# Patient Record
Sex: Female | Born: 1990 | Race: White | Hispanic: Yes | Marital: Single | Smoking: Never smoker
Health system: Southern US, Community
[De-identification: ages and names within clinical notes are randomized; demographics above are authoritative.]

## PROBLEM LIST (undated history)

## (undated) DIAGNOSIS — J45909 Unspecified asthma, uncomplicated: Secondary | ICD-10-CM

## (undated) DIAGNOSIS — G8929 Other chronic pain: Secondary | ICD-10-CM

## (undated) DIAGNOSIS — J40 Bronchitis, not specified as acute or chronic: Secondary | ICD-10-CM

## (undated) DIAGNOSIS — R51 Headache: Secondary | ICD-10-CM

## (undated) DIAGNOSIS — T7840XA Allergy, unspecified, initial encounter: Secondary | ICD-10-CM

## (undated) HISTORY — DX: Headache: R51

## (undated) HISTORY — DX: Allergy, unspecified, initial encounter: T78.40XA

## (undated) HISTORY — PX: TONSILLECTOMY AND ADENOIDECTOMY: SHX28

## (undated) HISTORY — DX: Other chronic pain: G89.29

## (undated) HISTORY — DX: Bronchitis, not specified as acute or chronic: J40

## (undated) HISTORY — DX: Unspecified asthma, uncomplicated: J45.909

---
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
## 2012-06-29 ENCOUNTER — Encounter: Payer: Self-pay | Admitting: Internal Medicine

## 2012-06-29 ENCOUNTER — Ambulatory Visit (INDEPENDENT_AMBULATORY_CARE_PROVIDER_SITE_OTHER): Payer: Managed Care, Other (non HMO) | Admitting: Internal Medicine

## 2012-06-29 VITALS — BP 102/70 | HR 90 | Temp 97.9°F | Ht 64.0 in | Wt 115.8 lb

## 2012-06-29 DIAGNOSIS — J45991 Cough variant asthma: Secondary | ICD-10-CM

## 2012-06-29 MED ORDER — BUDESONIDE-FORMOTEROL FUMARATE 80-4.5 MCG/ACT IN AERO: 2.0000 | INHALATION_SPRAY | Freq: Two times a day (BID) | RESPIRATORY_TRACT | Status: DC

## 2012-06-29 MED ORDER — TRAMADOL HCL 50 MG PO TABS: ORAL_TABLET | ORAL | Status: DC

## 2012-06-29 NOTE — Patient Instructions (Addendum)
Symbicort 80 Take 2 puffs first thing in am and then another 2 puffs about 12 hours later.   Stop advair and codeine cough syrup   Take mucinex dm  every 12 hours and supplement if needed with  tramadol 50 mg up to 1-2  every 4 hours to suppress the urge to cough. Swallowing water or using ice chips/non mint and menthol or chocolate containing candies (such as lifesavers or sugarless jolly ranchers) are also effective.  You should rest your voice and avoid activities that you know make you cough.  Once you have eliminated the cough for 3 straight days try reducing the tramadol first,  then the delsym as tolerated.    Use proaire for chest tightness.   Return before Dec 25 if not better, if improving fill symbicort and return in 4 weeks

## 2012-06-29 NOTE — Progress Notes (Signed)
  Subjective:    Patient ID: Megan Holland, female    DOB: 1991-03-27  MRN: [id number]  HPI  21 yowf with pattern of recurrent cough since age 21 referred to pulmonary clinic 06/29/2012 by Urgent Care for persistent cough.  06/29/2012 1st pulmonary eval cc recurrent cough typically in fall and then again 2-3 times each winter better with prednisone but problems coming off it with nausea and dehydration so rx with advair since late November 2013 and not better p onset in sept 2013. Cough is mostly dry, worse when lies down unless takes codeine containing cough syrups.   Takes proaire and helps tightness and occ sob, worse with temp changes  Sleeping ok without nocturnal  or early am exacerbation  of respiratory  c/o's or need for noct saba. Also denies any obvious fluctuation of symptoms with weather or environmental changes or other aggravating or alleviating factors except as outlined above      Review of Systems  Constitutional: Negative for fever, chills and unexpected weight change.  HENT: Positive for congestion and sore throat. Negative for ear pain, nosebleeds, rhinorrhea, sneezing, trouble swallowing, dental problem, voice change, postnasal drip and sinus pressure.   Eyes: Negative for visual disturbance.  Respiratory: Positive for cough and shortness of breath. Negative for choking.   Cardiovascular: Negative for chest pain and leg swelling.  Gastrointestinal: Negative for vomiting, abdominal pain and diarrhea.  Genitourinary: Negative for difficulty urinating.  Musculoskeletal: Negative for arthralgias.  Skin: Negative for rash.  Neurological: Negative for tremors, syncope and headaches.  Hematological: Does not bruise/bleed easily.       Objective:   Physical Exam  Pleasant amb wf nad Gen: No acute distress. Well nourished and well groomed.  Neurological: Alert and oriented to person, place, and time. Coordination normal.  Head: Normocephalic and atraumatic.  Eyes:  Conjunctivae are normal. Pupils are equal, round, and reactive to light. No scleral icterus.  Neck: Normal range of motion. Neck supple. No tracheal deviation or thyromegaly present. No cervical lymphadenopathy.  Cardiovascular: Normal rate, regular rhythm, normal heart sounds and intact distal pulses. Exam reveals no gallop and no friction rub. No murmur heard.  Respiratory: Effort normal. No respiratory distress. No chest wall tenderness. Breath sounds normal. No wheezes, rales or rhonchi.  GI: Soft. Bowel sounds are normal. The abdomen is soft and nontender. There is no rebound and no guarding.  Musculoskeletal: Normal range of motion. Extremities are nontender.  Skin: Skin is warm and dry. No rash noted. No diaphoresis. No erythema. No pallor. No clubbing, cyanosis, or edema.  Psychiatric: Normal mood and affect. Behavior is normal. Judgment and thought content normal.         Assessment & Plan:

## 2012-06-30 DIAGNOSIS — J45991 Cough variant asthma: Secondary | ICD-10-CM | POA: Insufficient documentation

## 2012-06-30 NOTE — Assessment & Plan Note (Signed)
The most common causes of chronic cough in immunocompetent adults include the following: upper airway cough syndrome (UACS), previously referred to as postnasal drip syndrome (PNDS), which is caused by variety of rhinosinus conditions; (2) asthma; (3) GERD; (4) chronic bronchitis from cigarette smoking or other inhaled environmental irritants; (5) nonasthmatic eosinophilic bronchitis; and (6) bronchiectasis.   These conditions, singly or in combination, have accounted for up to 94% of the causes of chronic cough in prospective studies.   Other conditions have constituted no >6% of the causes in prospective studies These have included bronchogenic carcinoma, chronic interstitial pneumonia, sarcoidosis, left ventricular failure, ACEI-induced cough, and aspiration from a condition associated with pharyngeal dysfunction.    Chronic cough is often simultaneously caused by more than one condition. A single cause has been found from 38 to 82% of the time, multiple causes from 18 to 62%. Multiply caused cough has been the result of three diseases up to 42% of the time.       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  For now try symbicort 80 2bid  The proper method of use, as well as anticipated side effects, of a metered-dose inhaler are discussed and demonstrated to the patient. Improved effectiveness after extensive coaching during this visit to a level of approximately  90%

## 2012-07-27 ENCOUNTER — Ambulatory Visit (INDEPENDENT_AMBULATORY_CARE_PROVIDER_SITE_OTHER): Payer: Managed Care, Other (non HMO) | Admitting: Internal Medicine

## 2012-07-27 ENCOUNTER — Encounter: Payer: Self-pay | Admitting: Internal Medicine

## 2012-07-27 VITALS — BP 100/60 | HR 75 | Temp 97.3°F | Ht 64.0 in | Wt 117.8 lb

## 2012-07-27 DIAGNOSIS — J45991 Cough variant asthma: Secondary | ICD-10-CM

## 2012-07-27 MED ORDER — BUDESONIDE-FORMOTEROL FUMARATE 80-4.5 MCG/ACT IN AERO: 2.0000 | INHALATION_SPRAY | Freq: Two times a day (BID) | RESPIRATORY_TRACT | Status: DC

## 2012-07-27 NOTE — Patient Instructions (Addendum)
Plan A  symbicort 80 2 every 12 hours continue this until your next visit  Plan B Only use your albuterol as a rescue medication to be used if you can't catch your breath by resting or doing a relaxed purse lip breathing pattern. The less you use it, the better it will work when you need it.   For nasal symptoms obstruction try nasal saline as needed and call for prescription of singulair   Please schedule a follow up visit in 3 months but call sooner if needed with pft's on return

## 2012-07-27 NOTE — Progress Notes (Signed)
Subjective:    Patient ID: Megan Holland, female    DOB: 1990/08/12  MRN: [id number]  HPI  22 yowf with pattern of recurrent cough since age 22 referred to pulmonary clinic 06/29/2012 by Urgent Care for persistent cough.  06/29/2012 1st pulmonary eval cc recurrent cough typically in fall and then again 2-3 times each winter better with prednisone but problems coming off it with nausea and dehydration so rx with advair since late November 2013 and not better p onset in sept 2013. Cough is mostly dry, worse when lies down unless takes codeine containing cough syrups.  rec Symbicort 80 Take 2 puffs first thing in am and then another 2 puffs about 12 hours later.  Stop advair and codeine cough syrup   Take mucinex dm  every 12 hours and supplement if needed with  tramadol 50 mg up to 1-2  every 4 hours  Once you have eliminated the cough for 3 straight days try reducing the tramadol first,  then the delsym as tolerated.   Use proaire for chest tightness.    07/27/2012 f/u ov/Elner Seifert cc completely better, no  sob or chest tightness or need for daytime saba or need for cough suppression  No obvious daytime variabilty or assoc chronic cough or cp or chest tightness, subjective wheeze overt sinus or hb symptoms. No unusual exp hx   Takes proaire and helps tightness and occ sob, worse with temp changes  Sleeping ok without nocturnal  or early am exacerbation  of respiratory  c/o's or need for noct saba. Also denies any obvious fluctuation of symptoms with weather or environmental changes or other aggravating or alleviating factors except as outlined above   ROS  The following are not active complaints unless bolded sore throat, dysphagia, dental problems, itching, sneezing,  nasal congestion or excess/ purulent secretions, ear ache,   fever, chills, sweats, unintended wt loss, pleuritic or exertional cp, hemoptysis,  orthopnea pnd or leg swelling, presyncope, palpitations, heartburn, abdominal pain,  anorexia, nausea, vomiting, diarrhea  or change in bowel or urinary habits, change in stools or urine, dysuria,hematuria,  rash, arthralgias, visual complaints, headache, numbness weakness or ataxia or problems with walking or coordination,  change in mood/affect or memory.               Objective:   Physical Exam  Wt Readings from Last 3 Encounters:  07/27/12 117 lb 12.8 oz (53.434 kg) (31.31%*)  06/29/12 115 lb 12.8 oz (52.527 kg) (27.48%*)  10/24/11 111 lb 12.8 oz (50.712 kg) (22.01%*)   * Growth percentiles are based on CDC 2-20 Years data.    Pleasant amb wf nad Gen: No acute distress. Well nourished and well groomed.  Neurological: Alert and oriented to person, place, and time. Coordination normal.  Head: Normocephalic and atraumatic.  Eyes: Conjunctivae are normal. Pupils are equal, round, and reactive to light. No scleral icterus.  Neck: Normal range of motion. Neck supple. No tracheal deviation or thyromegaly present. No cervical lymphadenopathy.  Cardiovascular: Normal rate, regular rhythm, normal heart sounds and intact distal pulses. Exam reveals no gallop and no friction rub. No murmur heard.  Respiratory: Effort normal. No respiratory distress. No chest wall tenderness. Breath sounds normal. No wheezes, rales or rhonchi.  GI: Soft. Bowel sounds are normal. The abdomen is soft and nontender. There is no rebound and no guarding.  Musculoskeletal: Normal range of motion. Extremities are nontender.  Skin: Skin is warm and dry. No rash noted. No diaphoresis. No erythema. No pallor.  No clubbing, cyanosis, or edema.  Psychiatric: Normal mood and affect. Behavior is normal. Judgment and thought content normal.         Assessment & Plan:

## 2012-07-28 NOTE — Assessment & Plan Note (Addendum)
-   hfa 90% 06/29/12  All goals of chronic asthma control met including optimal function and elimination of symptoms with minimal need for rescue therapy.  Contingencies discussed in full including contacting this office immediately if not controlling the symptoms using the rule of two's.    May consider step down to qvar p 3 months if all symptoms controlled and need for saba eliminated consistently

## 2012-11-01 ENCOUNTER — Telehealth: Payer: Self-pay | Admitting: Internal Medicine

## 2012-11-01 NOTE — Telephone Encounter (Signed)
Attempted to call pt x's 3 to make next ov per recall.  PT never returned calls.  Mailed recall letter 11/01/12. Megan Holland °

## 2012-12-06 ENCOUNTER — Institutional Professional Consult (permissible substitution): Payer: Managed Care, Other (non HMO) | Admitting: Cardiovascular Disease

## 2013-01-16 ENCOUNTER — Institutional Professional Consult (permissible substitution): Payer: Managed Care, Other (non HMO) | Admitting: Cardiovascular Disease

## 2013-02-27 ENCOUNTER — Encounter: Payer: Self-pay | Admitting: Cardiovascular Disease

## 2013-03-27 ENCOUNTER — Ambulatory Visit: Payer: Managed Care, Other (non HMO) | Admitting: Internal Medicine

## 2013-04-06 ENCOUNTER — Ambulatory Visit (INDEPENDENT_AMBULATORY_CARE_PROVIDER_SITE_OTHER): Payer: Managed Care, Other (non HMO) | Admitting: Internal Medicine

## 2013-04-06 ENCOUNTER — Encounter: Payer: Self-pay | Admitting: Internal Medicine

## 2013-04-06 VITALS — BP 112/70 | HR 80 | Temp 98.3°F | Ht 64.0 in | Wt 125.0 lb

## 2013-04-06 DIAGNOSIS — J45991 Cough variant asthma: Secondary | ICD-10-CM

## 2013-04-06 NOTE — Patient Instructions (Addendum)
Symbicort 80 2 puffs each am and take again 12 hours later if having any cough or breathing problems at all   Only use your albuterol(proaire)  as a rescue medication to be used if you can't catch your breath by resting or doing a relaxed purse lip breathing pattern. The less you use it, the better it will work when you need it. Don't leave home without it.  Pulmonary follow up in 6 months but call sooner if needed  (can add singulair to your plan over the phone if your chronic symptoms worsen at all)

## 2013-04-06 NOTE — Progress Notes (Signed)
Subjective:    Patient ID: Megan Holland, female    DOB: 09-14-1990  MRN: [id number]    Brief patient profile:  22 yowf  [employer] studying Kinesiology with pattern of recurrent cough since age 22 referred to pulmonary clinic 06/29/2012 by Urgent Care for persistent cough.    History of Present Illness  06/29/2012 1st pulmonary eval cc recurrent cough typically in fall and then again 2-3 times each winter better with prednisone but problems coming off it with nausea and dehydration so rx with advair since late November 2013 and not better p onset in sept 2013. Cough is mostly dry, worse when lies down unless takes codeine containing cough syrups.  rec Symbicort 80 Take 2 puffs first thing in am and then another 2 puffs about 12 hours later.  Stop advair and codeine cough syrup   Take mucinex dm  every 12 hours and supplement if needed with  tramadol 50 mg up to 1-2  every 4 hours  Once you have eliminated the cough for 3 straight days try reducing the tramadol first,  then the delsym as tolerated.   Use proaire for chest tightness.    07/27/2012 f/u ov/Megan Holland cc completely better, no  sob or chest tightness or need for daytime saba or need for cough suppression rec Plan A  symbicort 80 2 every 12 hours continue this until your next visit Plan B Only use your albuterol as a rescue medication to be used if you can't catch your breath by resting or doing a relaxed purse lip breathing pattern. The less you use it, the better it will work when you need it.  For nasal symptoms obstruction try nasal saline as needed and call for prescription of singulair    04/06/2013 f/u ov/Megan Holland re:  Chief Complaint  Patient presents with  . Follow-up    Pt c/o cough x 2 days- dry cough. Comes and goes with no specific trigger.   did not use symbicort on day of ov, no longer using saba at all, avg symb just 2 daily Nasal symptoms "forever"  no better though not really bothering her   No obvious day to  day or daytime variabilty or assoc chronic cough or cp or chest tightness, subjective wheeze overt  hb symptoms. No unusual exp hx or h/o childhood pna/ asthma or knowledge of premature birth.  Sleeping ok without nocturnal  or early am exacerbation  of respiratory  c/o's or need for noct saba. Also denies any obvious fluctuation of symptoms with weather or environmental changes or other aggravating or alleviating factors except as outlined above   Current Medications, Allergies, Complete Past Medical History, Past Surgical History, Family History, and Social History were reviewed in [employer] record.  ROS  The following are not active complaints unless bolded sore throat, dysphagia, dental problems, itching, sneezing,  nasal congestion or excess/ purulent secretions, ear ache,   fever, chills, sweats, unintended wt loss, pleuritic or exertional cp, hemoptysis,  orthopnea pnd or leg swelling, presyncope, palpitations, heartburn, abdominal pain, anorexia, nausea, vomiting, diarrhea  or change in bowel or urinary habits, change in stools or urine, dysuria,hematuria,  rash, arthralgias, visual complaints, headache, numbness weakness or ataxia or problems with walking or coordination,  change in mood/affect or memory.                    Objective:   Physical Exam  Wt Readings from Last 3 Encounters:  04/06/13 125 lb (56.7 kg) (  44%*, Z = -0.16)  07/27/12 117 lb 12.8 oz (53.434 kg) (31%*, Z = -0.49)  06/29/12 115 lb 12.8 oz (52.527 kg) (27%*, Z = -0.60)   * Growth percentiles are based on CDC 2-20 Years data.     Marland KitchenHEENT: nl dentition, turbinates, and orophanx. Nl external ear canals without cough reflex   NECK :  without JVD/Nodes/TM/ nl carotid upstrokes bilaterally   LUNGS: no acc muscle use, clear to A and P bilaterally without cough on insp or exp maneuvers   CV:  RRR  no s3 or murmur or increase in P2, no edema   ABD:  soft and nontender with nl  excursion in the supine position. No bruits or organomegaly, bowel sounds nl  MS:  warm without deformities, calf tenderness, cyanosis or clubbing  SKIN: warm and dry without lesions         spirometry 04/06/2013 wnl       Assessment & Plan:

## 2013-04-06 NOTE — Assessment & Plan Note (Signed)
-   hfa 90% 06/29/12> rx symbicort 80 > resolved  - spirometry wnl  04/06/2013   All goals of chronic asthma control met including optimal function and elimination of symptoms with minimal need for rescue therapy.  Contingencies discussed in full including contacting this office immediately if not controlling the symptoms using the rule of two's.     See instructions for specific recommendations which were reviewed directly with the patient who was given a copy with highlighter outlining the key components.

## 2013-11-23 ENCOUNTER — Ambulatory Visit: Payer: Managed Care, Other (non HMO) | Admitting: Internal Medicine

## 2013-11-30 ENCOUNTER — Ambulatory Visit (INDEPENDENT_AMBULATORY_CARE_PROVIDER_SITE_OTHER): Payer: Managed Care, Other (non HMO) | Admitting: Internal Medicine

## 2013-11-30 ENCOUNTER — Encounter: Payer: Self-pay | Admitting: Internal Medicine

## 2013-11-30 ENCOUNTER — Other Ambulatory Visit (INDEPENDENT_AMBULATORY_CARE_PROVIDER_SITE_OTHER): Payer: Managed Care, Other (non HMO)

## 2013-11-30 VITALS — BP 110/68 | HR 66 | Temp 98.1°F | Ht 65.0 in | Wt 130.4 lb

## 2013-11-30 DIAGNOSIS — J310 Chronic rhinitis: Secondary | ICD-10-CM

## 2013-11-30 DIAGNOSIS — J45991 Cough variant asthma: Secondary | ICD-10-CM

## 2013-11-30 LAB — CBC WITH DIFFERENTIAL/PLATELET
BASOS PCT: 0.5 % (ref 0.0–3.0)
Basophils Absolute: 0 10*3/uL (ref 0.0–0.1)
Eosinophils Absolute: 0 10*3/uL (ref 0.0–0.7)
Eosinophils Relative: 0.6 % (ref 0.0–5.0)
HCT: 43.6 % (ref 36.0–46.0)
HEMOGLOBIN: 14.8 g/dL (ref 12.0–15.0)
LYMPHS PCT: 29.7 % (ref 12.0–46.0)
Lymphs Abs: 1.5 10*3/uL (ref 0.7–4.0)
MCHC: 33.9 g/dL (ref 30.0–36.0)
MCV: 86.7 fl (ref 78.0–100.0)
MONOS PCT: 5.9 % (ref 3.0–12.0)
Monocytes Absolute: 0.3 10*3/uL (ref 0.1–1.0)
NEUTROS ABS: 3.2 10*3/uL (ref 1.4–7.7)
Neutrophils Relative %: 63.3 % (ref 43.0–77.0)
Platelets: 202 10*3/uL (ref 150.0–400.0)
RBC: 5.02 Mil/uL (ref 3.87–5.11)
RDW: 11.9 % (ref 11.5–14.6)
WBC: 5.1 10*3/uL (ref 4.5–10.5)

## 2013-11-30 MED ORDER — MONTELUKAST SODIUM 10 MG PO TABS: ORAL_TABLET | ORAL | Status: DC

## 2013-11-30 NOTE — Progress Notes (Signed)
Subjective:    Patient ID: Megan Holland, female    DOB: 03/20/1991  MRN: [id number]    Brief patient profile:  23 yowf  [employer] rising senior  studying Kinesiology with pattern of recurrent cough since age 89 referred to pulmonary clinic 06/29/2012 [facility] for persistent cough c/w cough variant asthma   History of Present Illness  06/29/2012 1st pulmonary eval cc recurrent cough typically in fall and then again 2-3 times each winter better with prednisone but problems coming off it with nausea and dehydration so rx with advair since late November 2013 and not better p onset in sept 2013. Cough is mostly dry, worse when lies down unless takes codeine containing cough syrups.  rec Symbicort 80 Take 2 puffs first thing in am and then another 2 puffs about 12 hours later.  Stop advair and codeine cough syrup   Take mucinex dm  every 12 hours and supplement if needed with  tramadol 50 mg up to 1-2  every 4 hours  Once you have eliminated the cough for 3 straight days try reducing the tramadol first,  then the delsym as tolerated.   Use proaire for chest tightness.    07/27/2012 f/u ov/Jovaun Levene cc completely better, no  sob or chest tightness or need for daytime saba or need for cough suppression rec Plan A  symbicort 80 2 every 12 hours continue this until your next visit Plan B Only use your albuterol as a rescue medication to be used if you can't catch your breath by resting or doing a relaxed purse lip breathing pattern. The less you use it, the better it will work when you need it.  For nasal symptoms obstruction try nasal saline as needed and call for prescription of singulair    04/06/2013 f/u ov/Madisun Hargrove re: cough variant asthma Chief Complaint  Patient presents with  . Follow-up    Pt c/o cough x 2 days- dry cough. Comes and goes with no specific trigger.   did not use symbicort on day of ov, no longer using saba at all, avg symb just 2 daily Nasal symptoms "forever"  no better  though not really bothering her  rec Symbicort 80 2 puffs each am and take again 12 hours later if having any cough or breathing problems at all Only use your albuterol(proaire)  as a rescue medication to be used if you can't catch your breath by resting or doing a relaxed purse lip breathing pattern. The less you use it, the better it will work when you need it. Don't leave home without it. rec Symbicort 80 2 puffs each am and take again 12 hours later if having any cough or breathing problems at all   Only use your albuterol(proaire)  as a rescue medication to be used if you can't catch your breath by resting or doing a relaxed purse lip breathing pattern. The less you use it, the better it will work when you need it. Don't leave home without it. Pulmonary follow up in 6 months but call sooner if needed  (can add singulair to your plan over the phone if your chronic symptoms worsen at all) Pulmonary follow up in 6 months but call sooner if needed  (can add singulair to your plan over the phone if your chronic symptoms worsen at all)   11/30/2013 f/u ov/Toneisha Savary re: on symbicort 80 2bid tapers to 2 qam when doing well Chief Complaint  Patient presents with  . Follow-up    Pt  states that her breathing overall doing well, has not needed rescue inhaler recently. She c/o nasal congestion that is not relieved with allergy meds.   nasal congestion x years year round, no better with clariton, no purulent secretions or seasonal flares  No obvious day to day or daytime variabilty or assoc chronic cough or cp or chest tightness, subjective wheeze overt  hb symptoms. No unusual exp hx or h/o childhood pna/ asthma or knowledge of premature birth.  Sleeping ok without nocturnal  or early am exacerbation  of respiratory  c/o's or need for noct saba. Also denies any obvious fluctuation of symptoms with weather or environmental changes or other aggravating or alleviating factors except as outlined above    Current Medications, Allergies, Complete Past Medical History, Past Surgical History, Family History, and Social History were reviewed in [facility] electronic medical record.  ROS  The following are not active complaints unless bolded sore throat, dysphagia, dental problems, itching, sneezing,  nasal congestion or excess/ purulent secretions, ear ache,   fever, chills, sweats, unintended wt loss, pleuritic or exertional cp, hemoptysis,  orthopnea pnd or leg swelling, presyncope, palpitations, heartburn, abdominal pain, anorexia, nausea, vomiting, diarrhea  or change in bowel or urinary habits, change in stools or urine, dysuria,hematuria,  rash, arthralgias, visual complaints, headache, numbness weakness or ataxia or problems with walking or coordination,  change in mood/affect or memory.                    Objective:   Physical Exam  11/30/13           130  Wt Readings from Last 3 Encounters:  04/06/13 125 lb (56.7 kg) (44%*, Z = -0.16)  07/27/12 117 lb 12.8 oz (53.434 kg) (31%*, Z = -0.49)  06/29/12 115 lb 12.8 oz (52.527 kg) (27%*, Z = -0.60)   * Growth percentiles are based on CDC 2-20 Years data.     Marland Kitchen.HEENT: nl dentition, turbinates, and orophanx. Nl external ear canals without cough reflex   NECK :  without JVD/Nodes/TM/ nl carotid upstrokes bilaterally   LUNGS: no acc muscle use, clear to A and P bilaterally without cough on insp or exp maneuvers   CV:  RRR  no s3 or murmur or increase in P2, no edema   ABD:  soft and nontender with nl excursion in the supine position. No bruits or organomegaly, bowel sounds nl  MS:  warm without deformities, calf tenderness, cyanosis or clubbing  SKIN: warm and dry without lesions         spirometry 04/06/2013 wnl       Assessment & Plan:

## 2013-11-30 NOTE — Patient Instructions (Signed)
singulair 10 mg one daily usually in evening x one month and then stop if no better  For drainage take chlortrimeton (chlorpheniramine) 4 mg every 4 hours available over the counter (may cause drowsiness)   GERD (REFLUX)  is an extremely common cause of respiratory symptoms, many times with no significant heartburn at all.    It can be treated with medication, but also with lifestyle changes including avoidance of late meals, excessive alcohol, smoking cessation, and avoid fatty foods, chocolate, peppermint, colas, red wine, and acidic juices such as orange juice.  NO MINT OR MENTHOL PRODUCTS SO NO COUGH DROPS  USE SUGARLESS CANDY INSTEAD (jolley ranchers or Stover's)  NO OIL BASED VITAMINS - use powdered substitutes.  Please remember to go to the lab   department downstairs for your tests - we will call you with the results when they are available.    Please schedule a follow up office visit in 6 m, call sooner if needed

## 2013-12-02 NOTE — Assessment & Plan Note (Addendum)
Has never tried singulair or undergone allergy eval > rec both plus trial of 1st gen H1

## 2013-12-02 NOTE — Assessment & Plan Note (Signed)
-  hfa 90% 06/29/12> rx symbicort 80 > resolved  - spirometry wnl  04/06/2013   All goals of chronic asthma control met including optimal function and elimination of symptoms with minimal need for rescue therapy.  Contingencies discussed in full including contacting this office immediately if not controlling the symptoms using the rule of two's.

## 2013-12-03 ENCOUNTER — Telehealth: Payer: Self-pay | Admitting: Internal Medicine

## 2013-12-03 ENCOUNTER — Encounter: Payer: Self-pay | Admitting: Internal Medicine

## 2013-12-03 LAB — ALLERGY PROFILE REGION II-DC, DE, MD, ~~LOC~~, VA
Allergen, D pternoyssinus,d7: 6.56 kU/L — ABNORMAL HIGH
Alternaria Alternata: <0.1 kU/L
Bermuda Grass: <0.1 kU/L
Box Elder IgE: <0.1 kU/L
Cat Dander: <0.1 kU/L
Cladosporium Herbarum: <0.1 kU/L
Cockroach: <0.1 kU/L
Common Ragweed: <0.1 kU/L
D. FARINAE: 9.3 kU/L — AB
Dog Dander: <0.1 kU/L
Elm IgE: <0.1 kU/L
IGE (IMMUNOGLOBULIN E), SERUM: 24.2 [IU]/mL (ref 0.0–180.0)
Meadow Grass: <0.1 kU/L
Oak: <0.1 kU/L
Pecan/Hickory Tree IgE: <0.1 kU/L

## 2013-12-03 NOTE — Progress Notes (Signed)
Quick Note:  LMTCB ______ 

## 2013-12-03 NOTE — Progress Notes (Signed)
Quick Note:  Spoke with pt and notified of results per Dr. Wert. Pt verbalized understanding and denied any questions.  ______ 

## 2013-12-03 NOTE — Telephone Encounter (Signed)
Spoke with pt and notified of lab results per Dr. Wert. Pt verbalized understanding and denied any questions.  

## 2014-04-14 ENCOUNTER — Ambulatory Visit (INDEPENDENT_AMBULATORY_CARE_PROVIDER_SITE_OTHER): Payer: Managed Care, Other (non HMO) | Admitting: Family Medicine

## 2014-04-14 VITALS — BP 100/62 | HR 81 | Temp 98.0°F | Resp 16 | Ht 63.0 in | Wt 131.2 lb

## 2014-04-14 DIAGNOSIS — J209 Acute bronchitis, unspecified: Secondary | ICD-10-CM

## 2014-04-14 LAB — POCT CBC
GRANULOCYTE PERCENT: 60.7 % (ref 37–80)
HCT, POC: 42.5 % (ref 37.7–47.9)
Hemoglobin: 13.9 g/dL (ref 12.2–16.2)
Lymph, poc: 2 (ref 0.6–3.4)
MCH, POC: 29 pg (ref 27–31.2)
MCHC: 32.8 g/dL (ref 31.8–35.4)
MCV: 88.5 fL (ref 80–97)
MID (cbc): 0.6 (ref 0–0.9)
MPV: 8.3 fL (ref 0–99.8)
POC GRANULOCYTE: 3.9 (ref 2–6.9)
POC LYMPH %: 30.7 % (ref 10–50)
POC MID %: 8.6 %M (ref 0–12)
Platelet Count, POC: 196 10*3/uL (ref 142–424)
RBC: 4.8 M/uL (ref 4.04–5.48)
RDW, POC: 12.2 %
WBC: 6.4 10*3/uL (ref 4.6–10.2)

## 2014-04-14 MED ORDER — HYDROCOD POLST-CHLORPHEN POLST 10-8 MG/5ML PO LQCR: 5.0000 mL | Freq: Two times a day (BID) | ORAL | Status: DC | PRN

## 2014-04-14 MED ORDER — DEXTROMETHORPHAN POLISTIREX 30 MG/5ML PO LQCR: 60.0000 mg | ORAL | Status: DC

## 2014-04-14 MED ORDER — HYDROCOD POLST-CPM POLST ER 5-4 MG PO CP12: 1.0000 | ORAL_CAPSULE | Freq: Two times a day (BID) | ORAL | Status: DC | PRN

## 2014-04-14 NOTE — Patient Instructions (Signed)

## 2014-04-14 NOTE — Progress Notes (Signed)
Subjective:    Patient ID: Nyhla Holland, female    DOB: [birth year], 23 y.o.   MRN: [id number] Chief Complaint  Patient presents with  . Bronchitis    [facility]-Not any better    HPI  Using symbicort but hasn't needed albuterol at all.  Sxs started 1 wk ago - she was treated with zpack and guaifenesin-codeine cough syrup which helps at night but not using anything during the day.  Using otc cough drops and mucinex which hasn't seemed to help.  Had a fever last week but still with nasal congestion.  Cough is productive in morning but more dry during the day.  No sinus pressure or pharyngitis but cough makes ribs/sides hurt. Gets bronchitis yearly.  Last yr had it for 4 mos and only thing that helped was codiene cough syrup and codiene pills. Pt reports zpack "never works for her".  Wonders if there is something she can take for her cough during the day since she doesn't want to use the hydrocodone while in class.  Delsym doesn't seem to be effective enough - she just coughs through it. Mucinex has not helped at all. Had severe allergic reaction to tessalon pearles.  Past Medical History  Diagnosis Date  . Bronchitis   . Chronic headaches   . Asthma   . Allergy    Current Outpatient Prescriptions on File Prior to Visit  Medication Sig Dispense Refill  . albuterol (PROAIR HFA) 108 (90 BASE) MCG/ACT inhaler Inhale 2 puffs into the lungs every 6 (six) hours as needed.      . Ascorbic Acid (VITAMIN C) 100 MG tablet Take 100 mg by mouth daily.      . budesonide-formoterol (SYMBICORT) 80-4.5 MCG/ACT inhaler Inhale 2 puffs into the lungs 2 (two) times daily.  1 Inhaler  12  . Cranberry 125 MG TABS Take 1 tablet by mouth daily.      . montelukast (SINGULAIR) 10 MG tablet One at bedtime every night  30 tablet  11  . Multiple Vitamins-Minerals (MULTIVITAL PO) Take 1 tablet by mouth daily.      . norethindrone-ethinyl estradiol (MICROGESTIN,JUNEL,LOESTRIN) 1-20 MG-MCG  tablet Take 1 tablet by mouth daily.       No current facility-administered medications on file prior to visit.   Allergies  Allergen Reactions  . Prednisone     Severe dehydration     Review of Systems  Constitutional: Positive for fatigue. Negative for fever, chills, diaphoresis and activity change.  HENT: Positive for congestion. Negative for rhinorrhea, sinus pressure and sore throat.   Respiratory: Positive for cough. Negative for chest tightness, shortness of breath and wheezing.   Cardiovascular: Positive for chest pain. Negative for palpitations and leg swelling.  Musculoskeletal: Positive for arthralgias. Negative for back pain.  Hematological: Negative for adenopathy.  Psychiatric/Behavioral: Positive for sleep disturbance.       Objective:   Physical Exam  Constitutional: She is oriented to person, place, and time. She appears well-developed and well-nourished. No distress.  HENT:  Head: Normocephalic and atraumatic.  Right Ear: Tympanic membrane, external ear and ear canal normal.  Left Ear: Tympanic membrane, external ear and ear canal normal.  Nose: Nose normal. No mucosal edema or rhinorrhea.  Mouth/Throat: Uvula is midline, oropharynx is clear and moist and mucous membranes are normal. No oropharyngeal exudate.  Eyes: Conjunctivae are normal. Right eye exhibits no discharge. Left eye exhibits no discharge. No scleral icterus.  Neck: Normal range of  motion. Neck supple.  Cardiovascular: Normal rate, regular rhythm, normal heart sounds and intact distal pulses.   Pulmonary/Chest: Effort normal and breath sounds normal.  Lymphadenopathy:    She has no cervical adenopathy.  Neurological: She is alert and oriented to person, place, and time.  Skin: Skin is warm and dry. She is not diaphoretic. No erythema.  Psychiatric: She has a normal mood and affect. Her behavior is normal.   BP 100/62  Pulse 81  Temp(Src) 98 F (36.7 C) (Oral)  Resp 16  Ht  (1.6 m)   Wt 131 lb 4 oz (59.535 kg)  BMI 23.26 kg/m2  SpO2 100%  LMP 03/24/2014  Frequent harsh dry hacking cough throughout OV.      Results for orders placed in visit on 04/14/14  POCT CBC      Result Value Ref Range   WBC 6.4  4.6 - 10.2 K/uL   Lymph, poc 2.0  0.6 - 3.4   POC LYMPH PERCENT 30.7  10 - 50 %L   MID (cbc) 0.6  0 - 0.9   POC MID % 8.6  0 - 12 %M   POC Granulocyte 3.9  2 - 6.9   Granulocyte percent 60.7  37 - 80 %G   RBC 4.80  4.04 - 5.48 M/uL   Hemoglobin 13.9  12.2 - 16.2 g/dL   HCT, POC 16.1  09.6 - 47.9 %   MCV 88.5  80 - 97 fL   MCH, POC 29.0  27 - 31.2 pg   MCHC 32.8  31.8 - 35.4 g/dL   RDW, POC 04.5     Platelet Count, POC 196  142 - 424 K/uL   MPV 8.3  0 - 99.8 fL     Assessment & Plan:   Acute bronchitis, unspecified organism - Plan: POCT CBC S/p zpack w/o sig improvement w/ nml cbc so viral etiology. Use delsym qam and hydrocodone qhs.  RTC if sxs worsen or asthma flairs.  Allergic to prednisone - cont singulair and symbicort. Meds ordered this encounter  Medications  . dextromethorphan (DELSYM) 30 MG/5ML liquid    Sig: Take 10 mLs (60 mg total) by mouth every morning.    Dispense:  89 mL    Refill:  0  . chlorpheniramine-HYDROcodone (TUSSIONEX PENNKINETIC ER) 10-8 MG/5ML LQCR    Sig: Take 5 mLs by mouth every 12 (twelve) hours as needed.    Dispense:  60 mL    Refill:  0  . Hydrocod Polst-Chlorphen Polst 5-4 MG CP12    Sig: Take 1 tablet by mouth 2 (two) times daily as needed.    Dispense:  14 each    Refill:  0     Norberto Sorenson, MD MPH

## 2014-05-29 ENCOUNTER — Encounter: Payer: Self-pay | Admitting: Internal Medicine

## 2014-05-29 ENCOUNTER — Ambulatory Visit (INDEPENDENT_AMBULATORY_CARE_PROVIDER_SITE_OTHER): Payer: Managed Care, Other (non HMO) | Admitting: Internal Medicine

## 2014-05-29 ENCOUNTER — Ambulatory Visit: Payer: Managed Care, Other (non HMO) | Admitting: Internal Medicine

## 2014-05-29 VITALS — BP 112/86 | HR 101 | Ht 65.0 in | Wt 134.6 lb

## 2014-05-29 DIAGNOSIS — J310 Chronic rhinitis: Secondary | ICD-10-CM

## 2014-05-29 DIAGNOSIS — J45991 Cough variant asthma: Secondary | ICD-10-CM

## 2014-05-29 DIAGNOSIS — R05 Cough: Secondary | ICD-10-CM

## 2014-05-29 DIAGNOSIS — R058 Other specified cough: Secondary | ICD-10-CM

## 2014-05-29 MED ORDER — BECLOMETHASONE DIPROPIONATE 80 MCG/ACT IN AERS: INHALATION_SPRAY | RESPIRATORY_TRACT | Status: DC

## 2014-05-29 MED ORDER — FAMOTIDINE 20 MG PO TABS: ORAL_TABLET | ORAL | Status: DC

## 2014-05-29 MED ORDER — AZELASTINE-FLUTICASONE 137-50 MCG/ACT NA SUSP: 1.0000 | Freq: Two times a day (BID) | NASAL | Status: DC

## 2014-05-29 MED ORDER — PANTOPRAZOLE SODIUM 40 MG PO TBEC: 40.0000 mg | DELAYED_RELEASE_TABLET | Freq: Every day | ORAL | Status: DC

## 2014-05-29 NOTE — Patient Instructions (Signed)
Start dymista one twice daily and fill the prescription if it helps your nasal symptoms  For cough take delsym 2 tsp every 12 hours until no longer coughing and at all and if can't stop completely > stronger cough medication is fine when you can afford to be sleepy during the day  Stop symbicort and start qvar 80 one twice daily  Pantoprazole (protonix) 40 mg   Take 30-60 min before first meal of the day and Pepcid 20 mg one bedtime until no longer coughing or needing cough medications for over a week   GERD (REFLUX)  is an extremely common cause of respiratory symptoms just like yours , many times with no obvious heartburn at all.    It can be treated with medication, but also with lifestyle changes including avoidance of late meals, excessive alcohol, smoking cessation, and avoid fatty foods, chocolate, peppermint, colas, red wine, and acidic juices such as orange juice.  NO MINT OR MENTHOL PRODUCTS SO NO COUGH DROPS  USE SUGARLESS CANDY INSTEAD (Jolley ranchers or Stover's or Life Savers) or even ice chips will also do - the key is to swallow to prevent all throat clearing. NO OIL BASED VITAMINS - use powdered substitutes.   Please schedule a follow up office visit in 6 weeks, call sooner if needed

## 2014-05-29 NOTE — Progress Notes (Signed)
Subjective:    Patient ID: Megan Holland, female    DOB: 05/25/1991  MRN: [id number]    Brief patient profile:  22 yowf  [employer] rising senior  studying Kinesiology with pattern of recurrent cough since age 89 referred to pulmonary clinic 06/29/2012 by Ur[facility] for persistent cough c/w cough variant asthma   History of Present Illness  06/29/2012 1st pulmonary eval cc recurrent cough typically in fall and then again 2-3 times each winter better with prednisone but problems coming off it with nausea and dehydration so rx with advair since late November 2013 and not better p onset in sept 2013. Cough is mostly dry, worse when lies down unless takes codeine containing cough syrups.  rec Symbicort 80 Take 2 puffs first thing in am and then another 2 puffs about 12 hours later.  Stop advair and codeine cough syrup   Take mucinex dm  every 12 hours and supplement if needed with  tramadol 50 mg up to 1-2  every 4 hours  Once you have eliminated the cough for 3 straight days try reducing the tramadol first,  then the delsym as tolerated.   Use proaire for chest tightness.    07/27/2012 f/u ov/Megan Holland cc completely better, no  sob or chest tightness or need for daytime saba or need for cough suppression rec Plan A  symbicort 80 2 every 12 hours continue this until your next visit Plan B Only use your albuterol as a rescue medication to be used if you can't catch your breath by resting or doing a relaxed purse lip breathing pattern. The less you use it, the better it will work when you need it.  For nasal symptoms obstruction try nasal saline as needed and call for prescription of singulair    04/06/2013 f/u ov/Megan Holland re: cough variant asthma Chief Complaint  Patient presents with  . Follow-up    Pt c/o cough x 2 days- dry cough. Comes and goes with no specific trigger.   did not use symbicort on day of ov, no longer using saba at all, avg symb just 2 daily Nasal symptoms "forever"  no better  though not really bothering her  rec Symbicort 80 2 puffs each am and take again 12 hours later if having any cough or breathing problems at all Only use your albuterol(proaire)  as a rescue medication to be used if you can't catch your breath by resting or doing a relaxed purse lip breathing pattern. The less you use it, the better it will work when you need it. Don't leave home without it. rec Symbicort 80 2 puffs each am and take again 12 hours later if having any cough or breathing problems at all Only use your albuterol(proaire)  as a rescue medication     11/30/2013 f/u ov/Megan Holland re: on symbicort 80 2bid tapers to 2 qam when doing well Chief Complaint  Patient presents with  . Follow-up    Pt states that her breathing overall doing well, has not needed rescue inhaler recently. She c/o nasal congestion that is not relieved with allergy meds.   nasal congestion x years year round, no better with clariton, no purulent secretions or seasonal flares rec singulair 10 mg one daily usually in evening x one month and then stop if no better For drainage take chlortrimeton (chlorpheniramine) 4 mg every 4 hours available over the counter (may cause drowsiness)  GERD diet    05/29/2014 f/u ov/Megan Holland re: asthma Chief Complaint  Patient presents with  .  Follow-up    Pt c/o increased cough for the past 6-8 wks- non prod "had bronchitis 2 months ago".  She is using rescue inhaler 1 to 2 x per wk.   Not doing as well since early September 2015 and has tried prednisone > no change and added singulair> no change  Does not remember ever having clear nose and otc's including zyrtec going back to childhood Cough is daytime not noct and no better on saba On bcps    No obvious day to day or daytime variabilty or assoc limiting sob or     cp or chest tightness, subjective wheeze overt  hb symptoms. No unusual exp hx or h/o childhood pna/ asthma or knowledge of premature birth.  Sleeping ok without  nocturnal  or early am exacerbation  of respiratory  c/o's or need for noct saba. Also denies any obvious fluctuation of symptoms with weather or environmental changes or other aggravating or alleviating factors except as outlined above   Current Medications, Allergies, Complete Past Medical History, Past Surgical History, Family History, and Social History were reviewed in [facility] electronic medical record.  ROS  The following are not active complaints unless bolded sore throat, dysphagia, dental problems, itching, sneezing,  nasal congestion or excess/ purulent secretions, ear ache,   fever, chills, sweats, unintended wt loss, pleuritic or exertional cp, hemoptysis,  orthopnea pnd or leg swelling, presyncope, palpitations, heartburn, abdominal pain, anorexia, nausea, vomiting, diarrhea  or change in bowel or urinary habits, change in stools or urine, dysuria,hematuria,  rash, arthralgias, visual complaints, headache, numbness weakness or ataxia or problems with walking or coordination,  change in mood/affect or memory.                    Objective:   Physical Exam  11/30/13           130 > 05/29/2014  135  Wt Readings from Last 3 Encounters:  04/06/13 125 lb (56.7 kg) (44%*, Z = -0.16)  07/27/12 117 lb 12.8 oz (53.434 kg) (31%*, Z = -0.49)  06/29/12 115 lb 12.8 oz (52.527 kg) (27%*, Z = -0.60)    amb wf nad/ very healthy appearing     .HEENT: nl dentition,   oropharynx  with mild cobblestoning, turbinates with non-specific bilateral swelling . Nl external ear canals without cough reflex   NECK :  without JVD/Nodes/TM/ nl carotid upstrokes bilaterally   LUNGS: no acc muscle use, clear to A and P bilaterally without cough on insp or exp maneuvers   CV:  RRR  no s3 or murmur or increase in P2, no edema   ABD:  soft and nontender with nl excursion in the supine position. No bruits or organomegaly, bowel sounds nl  MS:  warm without deformities, calf tenderness, cyanosis  or clubbing  SKIN: warm and dry without lesions         spirometry 04/06/2013 wnl       Assessment & Plan:

## 2014-06-01 DIAGNOSIS — R05 Cough: Secondary | ICD-10-CM | POA: Insufficient documentation

## 2014-06-01 DIAGNOSIS — R058 Other specified cough: Secondary | ICD-10-CM | POA: Insufficient documentation

## 2014-06-01 NOTE — Assessment & Plan Note (Signed)
-   singulair trial 11/30/2013 plus prn 1st gen H1 > no better at all 05/29/2014 > d/c d - Allergy profile 11/30/13 > neg  ? Complicated by uacs > try dymista and off singulair as has made no difference to date.  Need to keep in mind bcp's may be contributing either directly to rhinitis or indirectly via reflux mech, discussed with pt.

## 2014-06-01 NOTE — Assessment & Plan Note (Signed)
-   hfa 90% 06/29/12> rx symbicort 80 > resolved  - spirometry wnl  04/06/2013  - allergy profile 11/30/2013 >  IgE  24.2 with dust POS RAST/ neg eos    Cough has  flared on symbicort 80 and singulair > ? If this is really asthma or upper airway cough syndrome (see sep a/p)  stop both and start qvar 80 one bid and gerd rx

## 2014-06-01 NOTE — Assessment & Plan Note (Signed)
Classic Upper airway cough syndrome, so named because it's frequently impossible to sort out how much is  CR/sinusitis with freq throat clearing (which can be related to primary GERD)   vs  causing  secondary (" extra esophageal")  GERD from wide swings in gastric pressure that occur with throat clearing, often  promoting self use of mint and menthol lozenges that reduce the lower esophageal sphincter tone and exacerbate the problem further in a cyclical fashion.   These are the same pts (now being labeled as having "irritable larynx syndrome" by some cough centers) who not infrequently have a history of having failed to tolerate ace inhibitors,  dry powder inhalers or biphosphonates or report having atypical reflux symptoms that don't respond to standard doses of PPI , and are easily confused as having aecopd or asthma flares by even experienced allergists/ pulmonologists.   Try max acid suppression while coughing/ ? Needs different birth control method if becomes refractory.

## 2014-07-16 ENCOUNTER — Ambulatory Visit: Payer: Managed Care, Other (non HMO) | Admitting: Internal Medicine

## 2014-07-23 ENCOUNTER — Ambulatory Visit: Payer: Managed Care, Other (non HMO) | Admitting: Internal Medicine

## 2014-07-26 ENCOUNTER — Ambulatory Visit (INDEPENDENT_AMBULATORY_CARE_PROVIDER_SITE_OTHER): Payer: Managed Care, Other (non HMO) | Admitting: Internal Medicine

## 2014-07-26 ENCOUNTER — Encounter: Payer: Self-pay | Admitting: Internal Medicine

## 2014-07-26 VITALS — BP 100/60 | HR 72 | Ht 65.0 in | Wt 131.0 lb

## 2014-07-26 DIAGNOSIS — J45991 Cough variant asthma: Secondary | ICD-10-CM

## 2014-07-26 DIAGNOSIS — Z23 Encounter for immunization: Secondary | ICD-10-CM

## 2014-07-26 NOTE — Progress Notes (Signed)
Subjective:    Patient ID: Megan DadaStefanie Kiraly, female    DOB: 04/17/1991  MRN: [id number]    Brief patient profile:  24 yowf  [employer]   senior  studying Kinesiology with pattern of recurrent cough since age 24 referred to pulmonary clinic 06/29/2012 by [facility] for persistent cough c/w cough variant asthma   History of Present Illness  06/29/2012 1st pulmonary eval cc recurrent cough typically in fall and then again 2-3 times each winter better with prednisone but problems coming off it with nausea and dehydration so rx with advair since late November 2013 and not better p onset in sept 2013. Cough is mostly dry, worse when lies down unless takes codeine containing cough syrups.  rec Symbicort 80 Take 2 puffs first thing in am and then another 2 puffs about 12 hours later.  Stop advair and codeine cough syrup       11/30/2013 f/u ov/Margaretann Abate re: on symbicort 80 2bid tapers to 2 qam when doing well Chief Complaint  Patient presents with  . Follow-up    Pt states that her breathing overall doing well, has not needed rescue inhaler recently. She c/o nasal congestion that is not relieved with allergy meds.   nasal congestion x years year round, no better with clariton, no purulent secretions or seasonal flares rec singulair 10 mg one daily usually in evening x one month and then stop if no better> stopped  For drainage take chlortrimeton (chlorpheniramine) 4 mg every 4 hours available over the counter (may cause drowsiness)  GERD diet    05/29/2014 f/u ov/Delphina Schum re: asthma Chief Complaint  Patient presents with  . Follow-up    Pt c/o increased cough for the past 6-8 wks- non prod "had bronchitis 2 months ago".  She is using rescue inhaler 1 to 2 x per wk.   Not doing as well since early September 2015 and has tried prednisone > no change and added singulair> no change  Does not remember ever having clear nose and otc's including zyrtec going back to childhood Cough is daytime not noct and no  better on saba On bcps rec Start dymista one twice daily and fill the prescription if it helps your nasal symptoms> not better so stopped  For cough take delsym 2 tsp every 12 hours until no longer coughing and at all and if can't stop completely > stronger cough medication is fine when you can afford to be sleepy during the day Stop symbicort and start qvar 80 one twice daily Pantoprazole (protonix) 40 mg   Take 30-60 min before first meal of the day and Pepcid 20 mg one bedtime until no longer coughing or needing cough medications for over a week    07/26/2014 f/u ov/Sacha Topor re: cough variant asthma On qvar 80 one bid and doing great, no longer on acid rx no saba use at all, good ex tol, no need for saba        No obvious day to day or daytime variabilty or assoc limiting sob or     cp or chest tightness, subjective wheeze overt  hb symptoms. No unusual exp hx or h/o childhood pna/ asthma or knowledge of premature birth.  Sleeping ok without nocturnal  or early am exacerbation  of respiratory  c/o's or need for noct saba. Also denies any obvious fluctuation of symptoms with weather or environmental changes or other aggravating or alleviating factors except as outlined above   Current Medications, Allergies, Complete Past Medical History, Past  Surgical History, Family History, and Social History were reviewed in [employer] record.  ROS  The following are not active complaints unless bolded sore throat, dysphagia, dental problems, itching, sneezing,  nasal congestion or excess/ purulent secretions, ear ache,   fever, chills, sweats, unintended wt loss, pleuritic or exertional cp, hemoptysis,  orthopnea pnd or leg swelling, presyncope, palpitations, heartburn, abdominal pain, anorexia, nausea, vomiting, diarrhea  or change in bowel or urinary habits, change in stools or urine, dysuria,hematuria,  rash, arthralgias, visual complaints, headache, numbness weakness or ataxia or  problems with walking or coordination,  change in mood/affect or memory.                    Objective:   Physical Exam  11/30/13           130 > 05/29/2014  135 > 07/26/2014    131  Wt Readings from Last 3 Encounters:  04/06/13 125 lb (56.7 kg) (44%*, Z = -0.16)  07/27/12 117 lb 12.8 oz (53.434 kg) (31%*, Z = -0.49)  06/29/12 115 lb 12.8 oz (52.527 kg) (27%*, Z = -0.60)    amb wf nad/ very healthy appearing     .HEENT: nl dentition,   oropharynx  . Nl external ear canals without cough reflex   NECK :  without JVD/Nodes/TM/ nl carotid upstrokes bilaterally   LUNGS: no acc muscle use, clear to A and P bilaterally without cough on insp or exp maneuvers   CV:  RRR  no s3 or murmur or increase in P2, no edema   ABD:  soft and nontender with nl excursion in the supine position. No bruits or organomegaly, bowel sounds nl  MS:  warm without deformities, calf tenderness, cyanosis or clubbing  SKIN: warm and dry without lesions         spirometry 04/06/2013 wnl       Assessment & Plan:

## 2014-07-26 NOTE — Patient Instructions (Signed)
Continue qvar 80 one twice daily  At the onset of any coughing increase the qvar to Take 2 puffs first thing in am and then another 2 puffs about 12 hours later.    and add back prilosec Take 30-60 min before first meal of the day   Return in one year, sooner if needed

## 2014-07-27 ENCOUNTER — Encounter: Payer: Self-pay | Admitting: Internal Medicine

## 2014-07-27 NOTE — Assessment & Plan Note (Signed)
-  hfa 90% 06/29/12> rx symbicort 80 > resolved  - spirometry wnl  04/06/2013  - allergy profile 11/30/2013 >  IgE  24.2 with dust POS RAST/ neg eos -  05/29/2014 flared on symbicort 80 and singulair > stop both and start qvar 80 one bid and gerd rx  - 07/26/14 better on just qvar 80 one bid   All goals of chronic asthma control met including optimal function and elimination of symptoms with minimal need for rescue therapy.  Contingencies discussed in full including contacting this office immediately if not controlling the symptoms using the rule of two's.

## 2014-07-29 ENCOUNTER — Telehealth: Payer: Self-pay | Admitting: *Deleted

## 2014-07-29 NOTE — Telephone Encounter (Signed)
Called and spoke with the pt  She states that she is taking singulair  She will continue  She forgot to ask at ov if there is something she can try for nasal congestion-? Nasal spray? Dymista did not help before Please advise, thanks!

## 2014-07-29 NOTE — Telephone Encounter (Signed)
-----   Message from Nyoka CowdenMichael B Wert, MD sent at 07/27/2014  9:25 AM EST ----- Need to clarify whether she's still on singulair   I have her as stopped it but it got relisted but the bottom line is I don't want her to change what she's doing so she should continue if taking and we should delete it if not

## 2014-07-29 NOTE — Telephone Encounter (Signed)
If dymista didn't help best option is allegra D and you have to ask for it behind the counter but if not better rec ENT eval next rather than allergy as the allergy screens were neg

## 2014-08-24 ENCOUNTER — Ambulatory Visit (INDEPENDENT_AMBULATORY_CARE_PROVIDER_SITE_OTHER): Payer: Managed Care, Other (non HMO) | Admitting: Emergency Medicine

## 2014-08-24 VITALS — BP 100/60 | HR 80 | Temp 98.7°F | Resp 16 | Ht 64.25 in | Wt 130.0 lb

## 2014-08-24 DIAGNOSIS — J209 Acute bronchitis, unspecified: Secondary | ICD-10-CM

## 2014-08-24 DIAGNOSIS — J0140 Acute pansinusitis, unspecified: Secondary | ICD-10-CM

## 2014-08-24 MED ORDER — PSEUDOEPHEDRINE-GUAIFENESIN ER 60-600 MG PO TB12: 1.0000 | ORAL_TABLET | Freq: Two times a day (BID) | ORAL | Status: DC

## 2014-08-24 MED ORDER — AMOXICILLIN-POT CLAVULANATE 875-125 MG PO TABS: 1.0000 | ORAL_TABLET | Freq: Two times a day (BID) | ORAL | Status: DC

## 2014-08-24 MED ORDER — HYDROCOD POLST-CHLORPHEN POLST 10-8 MG/5ML PO LQCR: 5.0000 mL | Freq: Two times a day (BID) | ORAL | Status: DC | PRN

## 2014-08-24 NOTE — Patient Instructions (Signed)
brAcute Bronchitis Bronchitis is inflammation of the airways that extend from the windpipe into the lungs (bronchi). The inflammation often causes mucus to develop. This leads to a cough, which is the most common symptom of bronchitis.  In acute bronchitis, the condition usually develops suddenly and goes away over time, usually in a couple weeks. Smoking, allergies, and asthma can make bronchitis worse. Repeated episodes of bronchitis may cause further lung problems.  CAUSES Acute bronchitis is most often caused by the same virus that causes a cold. The virus can spread from person to person (contagious) through coughing, sneezing, and touching contaminated objects. SIGNS AND SYMPTOMS   Cough.   Fever.   Coughing up mucus.   Body aches.   Chest congestion.   Chills.   Shortness of breath.   Sore throat.  DIAGNOSIS  Acute bronchitis is usually diagnosed through a physical exam. Your health care provider will also ask you questions about your medical history. Tests, such as chest X-rays, are sometimes done to rule out other conditions.  TREATMENT  Acute bronchitis usually goes away in a couple weeks. Oftentimes, no medical treatment is necessary. Medicines are sometimes given for relief of fever or cough. Antibiotic medicines are usually not needed but may be prescribed in certain situations. In some cases, an inhaler may be recommended to help reduce shortness of breath and control the cough. A cool mist vaporizer may also be used to help thin bronchial secretions and make it easier to clear the chest.  HOME CARE INSTRUCTIONS  Get plenty of rest.   Drink enough fluids to keep your urine clear or pale yellow (unless you have a medical condition that requires fluid restriction). Increasing fluids may help thin your respiratory secretions (sputum) and reduce chest congestion, and it will prevent dehydration.   Take medicines only as directed by your health care provider.  If  you were prescribed an antibiotic medicine, finish it all even if you start to feel better.  Avoid smoking and secondhand smoke. Exposure to cigarette smoke or irritating chemicals will make bronchitis worse. If you are a smoker, consider using nicotine gum or skin patches to help control withdrawal symptoms. Quitting smoking will help your lungs heal faster.   Reduce the chances of another bout of acute bronchitis by washing your hands frequently, avoiding people with cold symptoms, and trying not to touch your hands to your mouth, nose, or eyes.   Keep all follow-up visits as directed by your health care provider.  SEEK MEDICAL CARE IF: Your symptoms do not improve after 1 week of treatment.  SEEK IMMEDIATE MEDICAL CARE IF:  You develop an increased fever or chills.   You have chest pain.   You have severe shortness of breath.  You have bloody sputum.   You develop dehydration.  You faint or repeatedly feel like you are going to pass out.  You develop repeated vomiting.  You develop a severe headache. MAKE SURE YOU:   Understand these instructions.  Will watch your condition.  Will get help right away if you are not doing well or get worse. Document Released: 08/12/2004 Document Revised: 11/19/2013 Document Reviewed: 12/26/2012 [facility] Patient Information 2015 [place], [place]. This information is not intended to replace advice given to you by your health care provider. Make sure you discuss any questions you have with your health care provider. Sinusitis Sinusitis is redness, soreness, and inflammation of the paranasal sinuses. Paranasal sinuses are air pockets within the bones of your face (beneath the  eyes, the middle of the forehead, or above the eyes). In healthy paranasal sinuses, mucus is able to drain out, and air is able to circulate through them by way of your nose. However, when your paranasal sinuses are inflamed, mucus and air can become trapped. This can  allow bacteria and other germs to grow and cause infection. Sinusitis can develop quickly and last only a short time (acute) or continue over a long period (chronic). Sinusitis that lasts for more than 12 weeks is considered chronic.  CAUSES  Causes of sinusitis include:  Allergies.  Structural abnormalities, such as displacement of the cartilage that separates your nostrils (deviated septum), which can decrease the air flow through your nose and sinuses and affect sinus drainage.  Functional abnormalities, such as when the small hairs (cilia) that line your sinuses and help remove mucus do not work properly or are not present. SIGNS AND SYMPTOMS  Symptoms of acute and chronic sinusitis are the same. The primary symptoms are pain and pressure around the affected sinuses. Other symptoms include:  Upper toothache.  Earache.  Headache.  Bad breath.  Decreased sense of smell and taste.  A cough, which worsens when you are lying flat.  Fatigue.  Fever.  Thick drainage from your nose, which often is green and may contain pus (purulent).  Swelling and warmth over the affected sinuses. DIAGNOSIS  Your health care provider will perform a physical exam. During the exam, your health care provider may:  Look in your nose for signs of abnormal growths in your nostrils (nasal polyps).  Tap over the affected sinus to check for signs of infection.  View the inside of your sinuses (endoscopy) using an imaging device that has a light attached (endoscope). If your health care provider suspects that you have chronic sinusitis, one or more of the following tests may be recommended:  Allergy tests.  Nasal culture. A sample of mucus is taken from your nose, sent to a lab, and screened for bacteria.  Nasal cytology. A sample of mucus is taken from your nose and examined by your health care provider to determine if your sinusitis is related to an allergy. TREATMENT  Most cases of acute  sinusitis are related to a viral infection and will resolve on their own within 10 days. Sometimes medicines are prescribed to help relieve symptoms (pain medicine, decongestants, nasal steroid sprays, or saline sprays).  However, for sinusitis related to a bacterial infection, your health care provider will prescribe antibiotic medicines. These are medicines that will help kill the bacteria causing the infection.  Rarely, sinusitis is caused by a fungal infection. In theses cases, your health care provider will prescribe antifungal medicine. For some cases of chronic sinusitis, surgery is needed. Generally, these are cases in which sinusitis recurs more than 3 times per year, despite other treatments. HOME CARE INSTRUCTIONS   Drink plenty of water. Water helps thin the mucus so your sinuses can drain more easily.  Use a humidifier.  Inhale steam 3 to 4 times a day (for example, sit in the bathroom with the shower running).  Apply a warm, moist washcloth to your face 3 to 4 times a day, or as directed by your health care provider.  Use saline nasal sprays to help moisten and clean your sinuses.  Take medicines only as directed by your health care provider.  If you were prescribed either an antibiotic or antifungal medicine, finish it all even if you start to feel better. SEEK IMMEDIATE MEDICAL   CARE IF:  You have increasing pain or severe headaches.  You have nausea, vomiting, or drowsiness.  You have swelling around your face.  You have vision problems.  You have a stiff neck.  You have difficulty breathing. MAKE SURE YOU:   Understand these instructions.  Will watch your condition.  Will get help right away if you are not doing well or get worse. Document Released: 07/05/2005 Document Revised: 11/19/2013 Document Reviewed: 07/20/2011 ExitCare Patient Information 2015 [employer]. This information is not intended to replace advice given to you by your health care provider.  Make sure you discuss any questions you have with your health care provider.  

## 2014-08-24 NOTE — Progress Notes (Signed)
[facility] [address], [place] [id number] [phone]  Date:  08/24/2014   Name:  Megan Holland   DOB:  01/23/1991   MRN:  [id number]  PCP:  No PCP Per Patient    Chief Complaint: Sore Throat   History of Present Illness:  Megan HedgesStefanie Bradly Holland is a 24 y.o. very pleasant female patient who presents with the following:  2 roommates have tested positive for strep She had a sore throat and [facility] and tested [facility].  She was started on penicillin.  No improvement Now has cough and purulent sputum and post nasal drainage Fever last night.  No wheezing or shortness of breath. No improvement with over the counter medications or other home remedies.  Denies other complaint or health concern today.   Patient Active Problem List   Diagnosis Date Noted  . Upper airway cough syndrome 06/01/2014  . Chronic rhinitis 11/30/2013  . Cough variant asthma 06/30/2012    Past Medical History  Diagnosis Date  . Bronchitis   . Chronic headaches   . Asthma   . Allergy     Past Surgical History  Procedure Laterality Date  . Tonsillectomy and adenoidectomy  1995  . Tonsillectomy and adenoidectomy      History  Substance Use Topics  . Smoking status: Never Smoker   . Smokeless tobacco: Never Used  . Alcohol Use: No    Family History  Problem Relation Age of Onset  . Emphysema Maternal Grandmother     smoker  . Breast cancer Maternal Grandmother   . Allergies Mother   . Allergies Sister   . Heart disease Father   . Diabetes Father   . Hyperlipidemia Father   . Hypertension Father   . Stroke Father   . Skin cancer Maternal Grandfather   . Heart disease Maternal Grandfather   . Heart disease Paternal Grandfather     Allergies  Allergen Reactions  . Prednisone     Severe dehydration  . Tetracyclines & Related Hives    Medication list has been reviewed and updated.  Current Outpatient Prescriptions on File Prior to Visit   Medication Sig Dispense Refill  . albuterol (PROAIR HFA) 108 (90 BASE) MCG/ACT inhaler Inhale 2 puffs into the lungs every 6 (six) hours as needed.    . Ascorbic Acid (VITAMIN C) 100 MG tablet Take 100 mg by mouth daily.    . beclomethasone (QVAR) 80 MCG/ACT inhaler One twice daily 1 Inhaler 11  . Cranberry 125 MG TABS Take 1 tablet by mouth daily.    Marland Kitchen. dextromethorphan (DELSYM) 30 MG/5ML liquid Take 10 mLs (60 mg total) by mouth every morning. 89 mL 0  . famotidine (PEPCID) 20 MG tablet One at bedtime 30 tablet 2  . montelukast (SINGULAIR) 10 MG tablet Take 10 mg by mouth at bedtime.    . Multiple Vitamins-Minerals (MULTIVITAL PO) Take 1 tablet by mouth daily.    . norethindrone-ethinyl estradiol (MICROGESTIN,JUNEL,LOESTRIN) 1-20 MG-MCG tablet Take 1 tablet by mouth daily.     No current facility-administered medications on file prior to visit.    Review of Systems:  As per HPI, otherwise negative.    Physical Examination: Filed Vitals:   08/24/14 1332  BP: 100/60  Pulse: 80  Temp: 98.7 F (37.1 C)  Resp: 16   Filed Vitals:   08/24/14 1332  Height: 5' 4.25" (1.632 m)  Weight: 130 lb (58.968 kg)   Body mass index is 22.14 kg/(m^2).  Ideal Body Weight: Weight in (lb) to have BMI = 25: 146.5  GEN: WDWN, NAD, Non-toxic, A & O x 3 HEENT: Atraumatic, Normocephalic. Neck supple. No masses, No LAD. Ears and Nose: No external deformity. CV: RRR, No M/G/R. No JVD. No thrill. No extra heart sounds. PULM: CTA B, no wheezes, crackles, rhonchi. No retractions. No resp. distress. No accessory muscle use. ABD: S, NT, ND, +BS. No rebound. No HSM. EXTR: No c/c/e NEURO Normal gait.  PSYCH: Normally interactive. Conversant. Not depressed or anxious appearing.  Calm demeanor.    Assessment and Plan: Sinusitis Bronchitis augmentin mucinex tussionex  Signed,  Phillips Odor, MD

## 2014-08-27 ENCOUNTER — Ambulatory Visit (INDEPENDENT_AMBULATORY_CARE_PROVIDER_SITE_OTHER)
Admission: RE | Admit: 2014-08-27 | Discharge: 2014-08-27 | Disposition: A | Discharge status: Home / Self Care | Payer: Managed Care, Other (non HMO) | Source: Ambulatory Visit | Attending: Adult Health | Admitting: Adult Health

## 2014-08-27 ENCOUNTER — Ambulatory Visit (INDEPENDENT_AMBULATORY_CARE_PROVIDER_SITE_OTHER): Payer: Managed Care, Other (non HMO) | Admitting: Adult Health

## 2014-08-27 ENCOUNTER — Telehealth: Payer: Self-pay | Admitting: Internal Medicine

## 2014-08-27 VITALS — BP 104/64 | HR 82 | Temp 98.3°F | Ht 65.0 in | Wt 128.0 lb

## 2014-08-27 DIAGNOSIS — J45991 Cough variant asthma: Secondary | ICD-10-CM

## 2014-08-27 DIAGNOSIS — J209 Acute bronchitis, unspecified: Secondary | ICD-10-CM

## 2014-08-27 NOTE — Telephone Encounter (Signed)
Spoke with pt, c/o sinus congestion, chest congestion, chest tightness, sometimes prod cough with yellow/green mucus X1 week.  Scheduled at 2:45 to see TP (ok'ed by JJ).  Nothing further needed.

## 2014-08-27 NOTE — Patient Instructions (Signed)
Finish Augmentin as directed Mucinex was daily as needed for cough and congestion  Delsym 2 teaspoons twice daily as needed for cough. Tylenol as needed for fever. Fluids and rest Follow up with Dr. Sherene SiresWert as planned and as needed Please contact office for sooner follow up if symptoms do not improve or worsen or seek emergency care

## 2014-08-27 NOTE — Assessment & Plan Note (Signed)
Flare with bronchitis   Plan  inish Augmentin as directed Mucinex was daily as needed for cough and congestion  Delsym 2 teaspoons twice daily as needed for cough. Tylenol as needed for fever. Fluids and rest Follow up with Dr. Sherene SiresWert as planned and as needed Please contact office for sooner follow up if symptoms do not improve or worsen or seek emergency care

## 2014-08-27 NOTE — Progress Notes (Signed)
Subjective:    Patient ID: Niomi Valent, female    DOB: 10/27/1990  MRN: [id number]    Brief patient profile:  24 yowf  [employer]   senior  studying Kinesiology with pattern of recurrent cough since age 24 referred to pulmonary clinic 06/29/2012 by Urgent Care for persistent cough c/w cough variant asthma   History of Present Illness  06/29/2012 1st pulmonary eval cc recurrent cough typically in fall and then again 2-3 times each winter better with prednisone but problems coming off it with nausea and dehydration so rx with advair since late November 2013 and not better p onset in sept 2013. Cough is mostly dry, worse when lies down unless takes codeine containing cough syrups.  rec Symbicort 80 Take 2 puffs first thing in am and then another 2 puffs about 12 hours later.  Stop advair and codeine cough syrup       11/30/2013 f/u ov/Wert re: on symbicort 80 2bid tapers to 2 qam when doing well Chief Complaint  Patient presents with  . Follow-up    Pt states that her breathing overall doing well, has not needed rescue inhaler recently. She c/o nasal congestion that is not relieved with allergy meds.   nasal congestion x years year round, no better with clariton, no purulent secretions or seasonal flares rec singulair 10 mg one daily usually in evening x one month and then stop if no better> stopped  For drainage take chlortrimeton (chlorpheniramine) 4 mg every 4 hours available over the counter (may cause drowsiness)  GERD diet    05/29/2014 f/u ov/Wert re: asthma Chief Complaint  Patient presents with  . Follow-up    Pt c/o increased cough for the past 6-8 wks- non prod "had bronchitis 2 months ago".  She is using rescue inhaler 1 to 2 x per wk.   Not doing as well since early September 2015 and has tried prednisone > no change and added singulair> no change  Does not remember ever having clear nose and otc's including zyrtec going back to childhood Cough is daytime not noct and no  better on saba On bcps rec Start dymista one twice daily and fill the prescription if it helps your nasal symptoms> not better so stopped  For cough take delsym 2 tsp every 12 hours until no longer coughing and at all and if can't stop completely > stronger cough medication is fine when you can afford to be sleepy during the day Stop symbicort and start qvar 80 one twice daily Pantoprazole (protonix) 40 mg   Take 30-60 min before first meal of the day and Pepcid 20 mg one bedtime until no longer coughing or needing cough medications for over a week    07/26/2014 f/u ov/Wert re: cough variant asthma On qvar 80 one bid and doing great, no longer on acid rx no saba use at all, good ex tol, no need for saba  >>no changes   08/27/2014  Acute OV  She presents for an acute office visit Complains of  head congestion, sneezing, PND, prod cough with white mucus, wheezing w/ lying down, fever up to 100.9 x5 days.   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 2 of her roommates were positive for strep Patient denies the any significant improvement and went to urgent care on February 6 felt to have a sinusitis and  bronchitis. She was started on Augmentin twice daily Patient complains she's had no significant improvement in symptoms. Continues to have a productive cough with thick, yellow-green mucus, nasal congestion, drainage and fever up to 101. She denies any hemoptysis, chest pain, orthopnea, PND, leg swelling, joint pain. Stiffness, rash, recent travel. Says that cough is keeping her up. She has quite violent coughing episodes to the point of gagging. She has been taken some hydrocodone cough syrup with some relief in her nighttime cough.     Current Medications, Allergies, Complete Past Medical History, Past Surgical History, Family History, and Social History  were reviewed in [facility] electronic medical record.  ROS   Constitutional:   No  weight loss, night sweats,   +Fevers, chills, fatigue, or  lassitude.  HEENT:   No headaches,  Difficulty swallowing,  Tooth/dental problems, or  Sore throat,                No sneezing, itching, ear ache,  +nasal congestion, post nasal drip,   CV:  No chest pain,  Orthopnea, PND, swelling in lower extremities, anasarca, dizziness, palpitations, syncope.   GI  No heartburn, indigestion, abdominal pain, nausea, vomiting, diarrhea, change in bowel habits, loss of appetite, bloody stools.   Resp:  .  No chest wall deformity  Skin: no rash or lesions.  GU: no dysuria, change in color of urine, no urgency or frequency.  No flank pain, no hematuria   MS:  No joint pain or swelling.  No decreased range of motion.  No back pain.  Psych:  No change in mood or affect. No depression or anxiety.  No memory loss.                Objective:   Physical Exam  11/30/13           130 > 05/29/2014  135 > 07/26/2014    131 >128 08/27/2014    amb wf nad/ very healthy appearing     .HEENT: nl dentition,   oropharynx  . Nl external ear canals without cough reflex   NECK :  without JVD/Nodes/TM/ nl carotid upstrokes bilaterally, neg nuchal rigidity   LUNGS: no acc muscle use, clear to A and P bilaterally without cough on insp or exp maneuvers   CV:  RRR  no s3 or murmur or increase in P2, no edema   ABD:  soft and nontender with nl excursion in the supine position. No bruits or organomegaly, bowel sounds nl  MS:  warm without deformities, calf tenderness, cyanosis or clubbing  SKIN: warm and dry without lesions  , no rashes        spirometry 04/06/2013 wnl       Assessment & Plan:

## 2014-08-27 NOTE — Assessment & Plan Note (Signed)
Flare -slow to resolve  cxr with no sign of PNA  xopenex neb x 1   Plan  Finish Augmentin as directed Mucinex was daily as needed for cough and congestion  Delsym 2 teaspoons twice daily as needed for cough. Tylenol as needed for fever. Fluids and rest Follow up with Dr. Sherene SiresWert as planned and as needed Please contact office for sooner follow up if symptoms do not improve or worsen or seek emergency care

## 2014-08-28 MED ORDER — LEVALBUTEROL HCL 0.63 MG/3ML IN NEBU
0.6300 mg | INHALATION_SOLUTION | Freq: Once | RESPIRATORY_TRACT | Status: AC
  Administered 2014-08-28: 0.63 mg via RESPIRATORY_TRACT

## 2014-08-28 NOTE — Addendum Note (Signed)
Addended by: Boone MasterJONES, Tedford Berg E on: 08/28/2014 10:39 AM   Modules accepted: Orders

## 2014-11-11 ENCOUNTER — Ambulatory Visit (INDEPENDENT_AMBULATORY_CARE_PROVIDER_SITE_OTHER): Payer: Managed Care, Other (non HMO) | Admitting: Internal Medicine

## 2014-11-11 ENCOUNTER — Encounter: Payer: Self-pay | Admitting: Internal Medicine

## 2014-11-11 VITALS — BP 120/90 | HR 87 | Temp 97.9°F | Ht 65.0 in | Wt 127.0 lb

## 2014-11-11 DIAGNOSIS — J45991 Cough variant asthma: Secondary | ICD-10-CM | POA: Diagnosis not present

## 2014-11-11 DIAGNOSIS — R05 Cough: Secondary | ICD-10-CM | POA: Diagnosis not present

## 2014-11-11 DIAGNOSIS — R058 Other specified cough: Secondary | ICD-10-CM

## 2014-11-11 MED ORDER — BECLOMETHASONE DIPROPIONATE 40 MCG/ACT IN AERS: INHALATION_SPRAY | RESPIRATORY_TRACT | Status: DC

## 2014-11-11 MED ORDER — METHYLPREDNISOLONE ACETATE 80 MG/ML IJ SUSP
120.0000 mg | Freq: Once | INTRAMUSCULAR | Status: AC
  Administered 2014-11-11: 120 mg via INTRAMUSCULAR

## 2014-11-11 MED ORDER — TRAMADOL HCL 50 MG PO TABS: ORAL_TABLET | ORAL | Status: DC

## 2014-11-11 MED ORDER — PANTOPRAZOLE SODIUM 40 MG PO TBEC: 40.0000 mg | DELAYED_RELEASE_TABLET | Freq: Every day | ORAL | Status: DC

## 2014-11-11 NOTE — Assessment & Plan Note (Signed)

## 2014-11-11 NOTE — Assessment & Plan Note (Signed)
-   hfa 90% 06/29/12> rx symbicort 80 > resolved  - spirometry wnl  04/06/2013  - allergy profile 11/30/2013 >  IgE  24.2 with dust POS RAST/ neg eos -  05/29/2014 flared on symbicort 80 and singulair > stop both and start qvar 80 one bid and gerd rx  - 07/26/14 better on just qvar 80 one bid  -  Cough exac on qvar 80 2bid so rec qvar 40 2bid    I had an extended discussion with the patient reviewing all relevant studies completed to date and  lasting 15 to 20 minutes of a 25 minute visit on the following ongoing concerns:  1) very difficult to tell what's triggering the cough but she is clear on exam and flared on high dose qvar so rec reduce to 40 bid and focus on cough control (see cough a/p)  2) Each maintenance medication was reviewed in detail including most importantly the difference between maintenance and as needed and under what circumstances the prns are to be used.  Please see instructions for details which were reviewed in writing and the patient given a copy.

## 2014-11-11 NOTE — Patient Instructions (Signed)
Stop qvar 80 and start qvar 40 Take 2 puffs first thing in am and then another 2 puffs about 12 hours later.    First take delsym two tsp every 12 hours and supplement if needed with  tramadol 50 mg up to 2 every 4 hours to suppress the urge to cough at all or even clear your throat. Swallowing water or using ice chips/non mint and menthol containing candies (such as lifesavers or sugarless jolly ranchers) are also effective.  You should rest your voice and avoid activities that you know make you cough.  Once you have eliminated the cough for 3 straight days try reducing the tramadol first,  then the delsym as tolerated.      Protonix (pantoprazole) Take 30-60 min before first meal of the day and Pepcid 20 mg one bedtime plus chlorpheniramine 4 mg x 2 at bedtime (both available over the counter)  until cough is completely gone for at least a week without the need for cough suppression  For drainage take chlortrimeton (chlorpheniramine) 4 mg every 4 hours available over the counter (may cause drowsiness)   GERD (REFLUX)  is an extremely common cause of respiratory symptoms, many times with no significant heartburn at all.    It can be treated with medication, but also with lifestyle changes including avoidance of late meals, excessive alcohol, smoking cessation, and avoid fatty foods, chocolate, peppermint, colas, red wine, and acidic juices such as orange juice.  NO MINT OR MENTHOL PRODUCTS SO NO COUGH DROPS  USE HARD CANDY INSTEAD (jolley ranchers or Stover's or Lifesavers (all available in sugarless versions) NO OIL BASED VITAMINS - use powdered substitutes.  Please schedule a follow up office visit in 2 weeks, sooner if needed to see Tammy on return

## 2014-11-11 NOTE — Progress Notes (Signed)
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 Cough is mostly dry, worse when lies down unless takes codeine containing cough syrups.  rec Symbicort 80 Take 2 puffs first thing in am and then another 2 puffs about 12 hours later.  Stop advair and codeine cough syrup       11/30/2013 f/u ov/An Schnabel re: on symbicort 80 2bid tapers to 2 qam when doing well Chief Complaint  Patient presents with  . Follow-up    Pt states that her breathing overall doing well, has not needed rescue inhaler recently. She c/o nasal congestion that is not relieved with allergy meds.   nasal congestion x years year round, no better with clariton, no purulent secretions or seasonal flares rec singulair 10 mg one daily usually in evening x one month and then stop if no better> stopped  For drainage take chlortrimeton (chlorpheniramine) 4 mg every 4 hours available over the counter (may cause drowsiness)  GERD diet    05/29/2014 f/u ov/Fue Cervenka re: asthma Chief Complaint  Patient presents with  . Follow-up    Pt c/o increased cough for the past 6-8 wks- non prod "had bronchitis 2 months ago".  She is using rescue inhaler 1 to 2 x per wk.   Not doing as well since early September 2015 and has tried prednisone > no change and added singulair> no change  Does not remember ever having clear nose and otc's including zyrtec going back to  childhood Cough is daytime not noct and no better on saba On bcps rec Start dymista one twice daily and fill the prescription if it helps your nasal symptoms> not better so stopped  For cough take delsym 2 tsp every 12 hours until no longer coughing and at all and if can't stop completely > stronger cough medication is fine when you can afford to be sleepy during the day Stop symbicort and start qvar 80 one twice daily Pantoprazole (protonix) 40 mg   Take 30-60 min before first meal of the day and Pepcid 20 mg one bedtime until no longer coughing or needing cough medications for over a week    08/27/2014  Acute OV /NP  She presents for an acute office visit Complains of  head congestion, sneezing, PND, prod cough with white mucus, wheezing w/ lying down, fever up to 100.9 x5 days.   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 2 of her roommates were positive for strep Patient denies the any significant improvement and went to urgent care on February 6 felt to have a sinusitis and bronchitis. She was started on Augmentin twice daily Patient complains she's had no significant improvement in symptoms. Continues to have a productive cough with thick, yellow-green mucus,  nasal congestion, drainage and fever up to 101. She denies any hemoptysis, chest pain, orthopnea, PND, leg swelling, joint pain. Stiffness, rash, recent travel. Says that cough is keeping her up. She has quite violent coughing episodes to the point of gagging. She has been taken some hydrocodone cough syrup with some relief in her nighttime cough rec Finish Augmentin as directed Mucinex was daily as needed for cough and congestion  Delsym 2 teaspoons twice daily as needed for cough.     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 Acute  Visit    Pt c/o cough for approx 4 wks. She states cough is non prod, and sometimes coughs so much she is gasping for air. She is using her albuterol inhaler 2 x daily on average and has increased Qvar to 2 puffs bid.   was better for 4 weeks but did not continue gerd rx/ did stay on qvar and singulair and hs pepcid, then cough started back and has not responded to benadryl or cheratussin AC - cough is not dry barking 24/7 - saba only helps when coughs so hard starts to choke   No obvious day to day or daytime variabilty or assoc   cp or chest tightness, subjective wheeze or overt  hb symptoms. No unusual exp hx or h/o childhood pna/ asthma or knowledge of premature birth.  Sleeping ok without nocturnal  or early am exacerbation  of respiratory  c/o's or need for noct saba. Also denies any obvious fluctuation of symptoms with weather or environmental changes or other aggravating or alleviating factors except as outlined above   Current Medications, Allergies, Complete Past Medical History, Past Surgical History, Family History, and Social History were reviewed in [facility] electronic medical record.  ROS  The following are not active complaints unless bolded sore throat, dysphagia, dental problems, itching, sneezing,  nasal congestion or excess/ purulent secretions, ear ache,   fever, chills, sweats, unintended wt loss, pleuritic or exertional cp, hemoptysis,  orthopnea pnd or leg swelling, presyncope, palpitations, heartburn, abdominal pain, anorexia, nausea, vomiting, diarrhea  or change in bowel or urinary habits, change in stools or urine, dysuria,hematuria,  rash, arthralgias, visual complaints, headache, numbness weakness or ataxia or problems with walking or coordination,  change in mood/affect or memory.              Objective:   Physical Exam  11/30/13  130 > 05/29/2014  135 > 07/26/2014    131 >128 08/27/2014 > 11/11/2014   127   amb wf nad/ very healthy appearing   Vital signs  reviewed   HEENT: nl dentition,   oropharynx  . Nl external ear canals without cough reflex   NECK :  without JVD/Nodes/TM/ nl carotid upstrokes bilaterally, neg nuchal rigidity   LUNGS: no acc muscle use, clear to A and P bilaterally without cough on insp or exp maneuvers   CV:  RRR  no s3 or murmur or increase in P2, no edema   ABD:  soft and nontender with nl excursion in the supine position. No bruits or organomegaly, bowel sounds nl  MS:  warm without deformities, calf tenderness, cyanosis or clubbing  SKIN: warm and dry without lesions  , no rashes        spirometry 04/06/2013 wnl       Assessment & Plan:

## 2014-11-26 ENCOUNTER — Ambulatory Visit: Payer: Managed Care, Other (non HMO) | Admitting: Adult Health

## 2014-11-29 ENCOUNTER — Ambulatory Visit (INDEPENDENT_AMBULATORY_CARE_PROVIDER_SITE_OTHER): Payer: Managed Care, Other (non HMO) | Admitting: Adult Health

## 2014-11-29 ENCOUNTER — Encounter: Payer: Self-pay | Admitting: Adult Health

## 2014-11-29 VITALS — BP 108/78 | HR 73 | Temp 97.8°F | Ht 65.0 in | Wt 126.4 lb

## 2014-11-29 DIAGNOSIS — R058 Other specified cough: Secondary | ICD-10-CM

## 2014-11-29 DIAGNOSIS — R05 Cough: Secondary | ICD-10-CM | POA: Diagnosis not present

## 2014-11-29 DIAGNOSIS — J45991 Cough variant asthma: Secondary | ICD-10-CM

## 2014-11-29 NOTE — Progress Notes (Signed)
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 Cough is mostly dry, worse when lies down unless takes codeine containing cough syrups.  rec Symbicort 80 Take 2 puffs first thing in am and then another 2 puffs about 12 hours later.  Stop advair and codeine cough syrup       11/30/2013 f/u ov/Wert re: on symbicort 80 2bid tapers to 2 qam when doing well Chief Complaint  Patient presents with  . Follow-up    Pt states that her breathing overall doing well, has not needed rescue inhaler recently. She c/o nasal congestion that is not relieved with allergy meds.   nasal congestion x years year round, no better with clariton, no purulent secretions or seasonal flares rec singulair 10 mg one daily usually in evening x one month and then stop if no better> stopped  For drainage take chlortrimeton (chlorpheniramine) 4 mg every 4 hours available over the counter (may cause drowsiness)  GERD diet    05/29/2014 f/u ov/Wert re: asthma Chief Complaint  Patient presents with  . Follow-up    Pt c/o increased cough for the past 6-8 wks- non prod "had bronchitis 2 months ago".  She is using rescue inhaler 1 to 2 x per wk.   Not doing as well since early September 2015 and has tried prednisone > no change and added singulair> no change  Does not remember ever having clear nose and otc's including zyrtec going back to  childhood Cough is daytime not noct and no better on saba On bcps rec Start dymista one twice daily and fill the prescription if it helps your nasal symptoms> not better so stopped  For cough take delsym 2 tsp every 12 hours until no longer coughing and at all and if can't stop completely > stronger cough medication is fine when you can afford to be sleepy during the day Stop symbicort and start qvar 80 one twice daily Pantoprazole (protonix) 40 mg   Take 30-60 min before first meal of the day and Pepcid 20 mg one bedtime until no longer coughing or needing cough medications for over a week    08/27/2014  Acute OV /NP  She presents for an acute office visit Complains of  head congestion, sneezing, PND, prod cough with white mucus, wheezing w/ lying down, fever up to 100.9 x5 days.   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 2 of her roommates were positive for strep Patient denies the any significant improvement and went to urgent care on February 6 felt to have a sinusitis and bronchitis. She was started on Augmentin twice daily Patient complains she's had no significant improvement in symptoms. Continues to have a productive cough with thick, yellow-green mucus,  nasal congestion, drainage and fever up to 101. She denies any hemoptysis, chest pain, orthopnea, PND, leg swelling, joint pain. Stiffness, rash, recent travel. Says that cough is keeping her up. She has quite violent coughing episodes to the point of gagging. She has been taken some hydrocodone cough syrup with some relief in her nighttime cough rec Finish Augmentin as directed Mucinex was daily as needed for cough and congestion  Delsym 2 teaspoons twice daily as needed for cough.     11/11/2014 f/u ov/Wert re: uacs vs cough variant asthma  Chief Complaint  Patient presents with  . Acute  Visit    Pt c/o cough for approx 4 wks. She states cough is non prod, and sometimes coughs so much she is gasping for air. She is using her albuterol inhaler 2 x daily on average and has increased Qvar to 2 puffs bid.   was better for 4 weeks but did not continue gerd rx/ did stay on qvar and singulair and hs pepcid, then cough started back and has not responded to benadryl or cheratussin AC - cough is not dry barking 24/7 - saba only helps when coughs so hard starts to choke  >delsym and tramadol , PPI and pepcid   11/29/2014 Follow up : Asthma and Cough  Patient presents for a two-week follow-up. Seen last visit with a asthma flare along with a upper airway cough. Patient was treated with Delsym and tramadol for cough control. Started on a reflux, prevention regimen with Protonix and Pepcid. Patient says she is much improved with resolved cough. took about 3 days with Delsym and tramadol and cough resolved and she has stopped both She denies any wheezing, chest pain, orthopnea, PND, leg swelling, increased albuterol use or nocturnal awakenings.   Current Medications, Allergies, Complete Past Medical History, Past Surgical History, Family History, and Social History were reviewed in [facility] electronic medical record.  ROS  The following are not active complaints unless bolded sore throat, dysphagia, dental problems, itching, sneezing,   or excess/ purulent secretions, ear ache,   fever, chills, sweats, unintended wt loss, pleuritic or exertional cp, hemoptysis,  orthopnea pnd or leg swelling, presyncope, palpitations, heartburn, abdominal pain, anorexia, nausea, vomiting, diarrhea  or change in bowel or urinary habits, change in stools or urine, dysuria,hematuria,  rash, arthralgias, visual complaints, headache, numbness weakness or ataxia or problems with walking or coordination,  change in mood/affect or memory.              Objective:   Physical Exam  11/30/13  130 > 05/29/2014   135 > 07/26/2014    131 >128 08/27/2014 > 11/11/2014   127 >126 11/29/2014   amb wf nad/ very healthy appearing   Vital signs reviewed   HEENT: nl dentition,   oropharynx  . Nl external ear canals without cough reflex   NECK :  without JVD/Nodes/TM/ nl carotid upstrokes bilaterally,    LUNGS: no acc muscle use, clear to A and P bilaterally without cough on insp or exp maneuvers   CV:  RRR  no s3 or murmur or increase in P2, no edema   ABD:  soft and nontender with nl excursion in the supine position. No bruits or organomegaly, bowel sounds nl  MS:  warm without deformities, calf tenderness, cyanosis or clubbing  SKIN: warm and dry without lesions  , no rashes        spirometry 08/27/14 wnl       Assessment & Plan:

## 2014-11-29 NOTE — Assessment & Plan Note (Signed)
Resolved  D/c tramadol

## 2014-11-29 NOTE — Patient Instructions (Signed)
Continue on current regimen  follow up Dr. Sherene SiresWert  In 3 months

## 2014-11-29 NOTE — Assessment & Plan Note (Signed)
Doing well on QVAR , cough resolved.  Cont on current regimen .  D/c tramadol .

## 2014-11-29 NOTE — Progress Notes (Signed)
Chart/ progress note reviewed/ agree with a/p

## 2015-01-02 ENCOUNTER — Ambulatory Visit (INDEPENDENT_AMBULATORY_CARE_PROVIDER_SITE_OTHER): Payer: Managed Care, Other (non HMO) | Admitting: Internal Medicine

## 2015-01-02 ENCOUNTER — Encounter: Payer: Self-pay | Admitting: Internal Medicine

## 2015-01-02 VITALS — BP 110/70 | HR 77 | Ht 65.0 in | Wt 133.6 lb

## 2015-01-02 DIAGNOSIS — J45991 Cough variant asthma: Secondary | ICD-10-CM | POA: Diagnosis not present

## 2015-01-02 DIAGNOSIS — J310 Chronic rhinitis: Secondary | ICD-10-CM | POA: Diagnosis not present

## 2015-01-02 DIAGNOSIS — R05 Cough: Secondary | ICD-10-CM

## 2015-01-02 DIAGNOSIS — R058 Other specified cough: Secondary | ICD-10-CM

## 2015-01-02 NOTE — Assessment & Plan Note (Signed)
-  hfa 90% 06/29/12> rx symbicort 80 > resolved  - spirometry wnl  04/06/2013  - allergy profile 11/30/2013 >  IgE  24.2 with dust POS RAST/ neg eos -  05/29/2014 flared on symbicort 80 and singulair > stop both and start qvar 80 one bid and gerd rx  - 07/26/14 better on just qvar 80 one bid  -  Cough exac on qvar 80 2bid so rec qvar 40 2bid > iimproved   All goals of chronic asthma control met including optimal function and elimination of symptoms with minimal need for rescue therapy.  Contingencies discussed in full including contacting this office immediately if not controlling the symptoms using the rule of two's.     F/u can be yearly / prn in meantime

## 2015-01-02 NOTE — Progress Notes (Signed)
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 Cough is mostly dry, worse when lies down unless takes codeine containing cough syrups.  rec Symbicort 80 Take 2 puffs first thing in am and then another 2 puffs about 12 hours later.  Stop advair and codeine cough syrup       11/30/2013 f/u ov/Dominico Rod re: on symbicort 80 2bid tapers to 2 qam when doing well Chief Complaint  Patient presents with  . Follow-up    Pt states that her breathing overall doing well, has not needed rescue inhaler recently. She c/o nasal congestion that is not relieved with allergy meds.   nasal congestion x years year round, no better with clariton, no purulent secretions or seasonal flares rec singulair 10 mg one daily usually in evening x one month and then stop if no better> stopped  For drainage take chlortrimeton (chlorpheniramine) 4 mg every 4 hours available over the counter (may cause drowsiness)  GERD diet    05/29/2014 f/u ov/Nieves Barberi re: asthma Chief Complaint  Patient presents with  . Follow-up    Pt c/o increased cough for the past 6-8 wks- non prod "had bronchitis 2 months ago".  She is using rescue inhaler 1 to 2 x per wk.   Not doing as well since early September 2015 and has tried prednisone > no change and added singulair> no change  Does not remember ever having clear nose and otc's including zyrtec going back to  childhood Cough is daytime not noct and no better on saba On bcps rec Start dymista one twice daily and fill the prescription if it helps your nasal symptoms> not better so stopped  For cough take delsym 2 tsp every 12 hours until no longer coughing and at all and if can't stop completely > stronger cough medication is fine when you can afford to be sleepy during the day Stop symbicort and start qvar 80 one twice daily Pantoprazole (protonix) 40 mg   Take 30-60 min before first meal of the day and Pepcid 20 mg one bedtime until no longer coughing or needing cough medications for over a week    08/27/2014  Acute OV /NP  She presents for an acute office visit Complains of  head congestion, sneezing, PND, prod cough with white mucus, wheezing w/ lying down, fever up to 100.9 x5 days.   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 2 of her roommates were positive for strep Patient denies the any significant improvement and went to urgent care on February 6 felt to have a sinusitis and bronchitis. She was started on Augmentin twice daily Patient complains she's had no significant improvement in symptoms. Continues to have a productive cough with thick, yellow-green mucus,  nasal congestion, drainage and fever up to 101. She denies any hemoptysis, chest pain, orthopnea, PND, leg swelling, joint pain. Stiffness, rash, recent travel. Says that cough is keeping her up. She has quite violent coughing episodes to the point of gagging. She has been taken some hydrocodone cough syrup with some relief in her nighttime cough rec Finish Augmentin as directed Mucinex was daily as needed for cough and congestion  Delsym 2 teaspoons twice daily as needed for cough.     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 Acute  Visit    Pt c/o cough for approx 4 wks. She states cough is non prod, and sometimes coughs so much she is gasping for air. She is using her albuterol inhaler 2 x daily on average and has increased Qvar to 2 puffs bid.   was better for 4 weeks but did not continue gerd rx/ did stay on qvar and singulair and hs pepcid, then cough started back and has not responded to benadryl or cheratussin AC - cough is not dry barking 24/7 - saba only helps when coughs so hard starts to choke  >delsym and tramadol , PPI and pepcid   11/29/2014 NP Follow up : Asthma and Cough  Patient was treated with Delsym and tramadol for cough control. Started on a reflux, prevention regimen with Protonix and Pepcid. Patient says she is much improved with resolved cough. took about 3 days with Delsym and tramadol and cough resolved and she has stopped both rec no change rx     01/02/2015 f/u ov/Wandell Scullion re: cough variant asthma on qvar 40 2 bid/singulair/ppi and pepcid (started singulair back during her allergy season? Helped) Chief Complaint  Patient presents with  . Follow-up    Pt states following up early due to insurance change. She c/o PND but denies a cough. She has not had to use albuterol recently.     Body mass index is 22.23 kg/(m^2).    Still has daily sense of pnds controlled with chlortrimeton, no noct flares or resp problems sleeping     No obvious day to day or daytime variabilty or assoc chronic cough or cp or chest tightness, subjective wheeze overt sinus or hb symptoms. No unusual exp hx or h/o childhood pna/ asthma or knowledge of premature birth.  Sleeping ok without nocturnal  or early am exacerbation  of respiratory  c/o's or need for noct saba. Also denies any obvious fluctuation of symptoms with weather or environmental changes or other aggravating or alleviating factors except as outlined above   Current Medications, Allergies, Complete Past Medical History, Past Surgical History, Family History,  and Social History were reviewed in [employer] record.  ROS  The following are not active complaints unless bolded sore throat, dysphagia, dental problems, itching, sneezing,  nasal congestion or excess/ purulent secretions, ear ache,   fever, chills, sweats, unintended wt loss, pleuritic or exertional cp, hemoptysis,  orthopnea pnd or leg swelling, presyncope, palpitations, heartburn, abdominal pain, anorexia, nausea, vomiting, diarrhea  or change in bowel or urinary habits, change in stools or urine, dysuria,hematuria,  rash, arthralgias, visual complaints, headache, numbness weakness or ataxia or problems with walking or coordination,  change in mood/affect or memory.             Objective:   Physical Exam  11/30/13  130 > 05/29/2014  135 > 07/26/2014    131 >128 08/27/2014 > 11/11/2014  127 >126 11/29/2014> 01/02/2015   134   amb wf nad/ very healthy appearing   Vital signs reviewed   HEENT: nl dentition,   oropharynx  . Nl external ear canals without cough reflex   NECK :  without JVD/Nodes/TM/ nl carotid upstrokes bilaterally,    LUNGS: no acc muscle use, clear to A and P bilaterally without cough on insp or exp maneuvers   CV:  RRR  no s3 or murmur or increase in P2, no edema   ABD:  soft and nontender with nl excursion in the supine position. No bruits or organomegaly, bowel sounds nl  MS:  warm without deformities, calf tenderness, cyanosis or clubbing  SKIN: warm and dry without lesions  , no rashes        spirometry 08/27/14 wnl       Assessment & Plan:

## 2015-01-02 NOTE — Patient Instructions (Signed)
Ok to try off protonix and pepcid for now but restart right away for any flare   As needed For drainage take chlortrimeton (chlorpheniramine) 4 mg every 4 hours available over the counter (may cause drowsiness)   Please schedule a follow up visit in 12 months but call sooner if needed

## 2015-01-02 NOTE — Assessment & Plan Note (Signed)
-   singulair trial 11/30/2013 plus prn 1st gen H1 > no better at all 05/29/2014 > d/c d but flared off so started back    Ok to just use singulair whenever she feels it's helping but  It should be considered as a maint and chortrimeton as a true prn

## 2015-01-02 NOTE — Assessment & Plan Note (Signed)
Ok to try off but Explained the natural history of cyclical cough  and why it's necessary in patients at risk to treat GERD aggressively - at least  short term -   to reduce risk of evolving cyclical cough initially  triggered by epithelial injury and a heightened sensitivty to the effects of any upper airway irritants,  most importantly acid - related - then perpetuated by epithelial injury related to the cough itself as the upper airway collapses on itself.  That is, the more sensitive the epithelium becomes once it is damaged by the virus, the more the ensuing irritability> the more the cough, the more the secondary reflux (especially in those prone to reflux) the more the irritation of the sensitive mucosa and so on in a  Classic cyclical pattern.  I had an extended discussion with the patient reviewing all relevant studies completed to date and  lasting 15 to 20 minutes of a 25 minute visit on the following ongoing concerns:  Each maintenance medication was reviewed in detail including most importantly the difference between maintenance and as needed and under what circumstances the prns are to be used.  Please see instructions for details which were reviewed in writing and the patient given a copy.

## 2015-01-10 ENCOUNTER — Telehealth: Payer: Self-pay | Admitting: Internal Medicine

## 2015-01-10 MED ORDER — MONTELUKAST SODIUM 10 MG PO TABS: 10.0000 mg | ORAL_TABLET | Freq: Every day | ORAL | Status: DC

## 2015-01-10 MED ORDER — BECLOMETHASONE DIPROPIONATE 40 MCG/ACT IN AERS: INHALATION_SPRAY | RESPIRATORY_TRACT | Status: DC

## 2015-01-10 NOTE — Telephone Encounter (Signed)
Spoke with pt. Aware 3 month supply sent of qvar and singulair. Nothing further needed

## 2015-05-19 ENCOUNTER — Emergency Department (HOSPITAL_COMMUNITY)
Admission: EM | Admit: 2015-05-19 | Discharge: 2015-05-20 | Disposition: A | Discharge status: Home / Self Care | Payer: BLUE CROSS/BLUE SHIELD | Attending: Emergency Medicine | Admitting: Emergency Medicine

## 2015-05-19 ENCOUNTER — Encounter (HOSPITAL_COMMUNITY): Payer: Self-pay | Admitting: Emergency Medicine

## 2015-05-19 ENCOUNTER — Emergency Department (HOSPITAL_COMMUNITY): Payer: BLUE CROSS/BLUE SHIELD

## 2015-05-19 DIAGNOSIS — Z3202 Encounter for pregnancy test, result negative: Secondary | ICD-10-CM | POA: Insufficient documentation

## 2015-05-19 DIAGNOSIS — G8929 Other chronic pain: Secondary | ICD-10-CM | POA: Diagnosis not present

## 2015-05-19 DIAGNOSIS — R109 Unspecified abdominal pain: Secondary | ICD-10-CM

## 2015-05-19 DIAGNOSIS — Z7951 Long term (current) use of inhaled steroids: Secondary | ICD-10-CM | POA: Diagnosis not present

## 2015-05-19 DIAGNOSIS — Z79899 Other long term (current) drug therapy: Secondary | ICD-10-CM | POA: Diagnosis not present

## 2015-05-19 DIAGNOSIS — Z87440 Personal history of urinary (tract) infections: Secondary | ICD-10-CM | POA: Diagnosis not present

## 2015-05-19 DIAGNOSIS — R110 Nausea: Secondary | ICD-10-CM | POA: Diagnosis not present

## 2015-05-19 DIAGNOSIS — Z79818 Long term (current) use of other agents affecting estrogen receptors and estrogen levels: Secondary | ICD-10-CM | POA: Diagnosis not present

## 2015-05-19 DIAGNOSIS — R1032 Left lower quadrant pain: Secondary | ICD-10-CM | POA: Insufficient documentation

## 2015-05-19 DIAGNOSIS — R1012 Left upper quadrant pain: Secondary | ICD-10-CM | POA: Insufficient documentation

## 2015-05-19 DIAGNOSIS — J45909 Unspecified asthma, uncomplicated: Secondary | ICD-10-CM | POA: Insufficient documentation

## 2015-05-19 LAB — COMPREHENSIVE METABOLIC PANEL
ALT: 13 U/L — ABNORMAL LOW (ref 14–54)
AST: 27 U/L (ref 15–41)
Albumin: 4.4 g/dL (ref 3.5–5.0)
Alkaline Phosphatase: 47 U/L (ref 38–126)
Anion gap: 6 (ref 5–15)
BUN: 10 mg/dL (ref 6–20)
CO2: 25 mmol/L (ref 22–32)
Calcium: 9.2 mg/dL (ref 8.9–10.3)
Chloride: 105 mmol/L (ref 101–111)
Creatinine, Ser: 0.78 mg/dL (ref 0.44–1.00)
GFR calc Af Amer: >60 mL/min (ref >60)
GFR calc non Af Amer: >60 mL/min (ref >60)
Glucose, Bld: 81 mg/dL (ref 65–99)
Potassium: 3.8 mmol/L (ref 3.5–5.1)
Sodium: 136 mmol/L (ref 135–145)
Total Bilirubin: 0.7 mg/dL (ref 0.3–1.2)
Total Protein: 7.9 g/dL (ref 6.5–8.1)

## 2015-05-19 LAB — CBC
HCT: 44.3 % (ref 36.0–46.0)
Hemoglobin: 14.8 g/dL (ref 12.0–15.0)
MCH: 29.2 pg (ref 26.0–34.0)
MCHC: 33.4 g/dL (ref 30.0–36.0)
MCV: 87.5 fL (ref 78.0–100.0)
Platelets: 213 10*3/uL (ref 150–400)
RBC: 5.06 MIL/uL (ref 3.87–5.11)
RDW: 12 % (ref 11.5–15.5)
WBC: 6.1 10*3/uL (ref 4.0–10.5)

## 2015-05-19 LAB — LIPASE, BLOOD: Lipase: 37 U/L (ref 11–51)

## 2015-05-19 LAB — I-STAT BETA HCG BLOOD, ED (MC, WL, AP ONLY): I-stat hCG, quantitative: <5 m[IU]/mL (ref <5)

## 2015-05-19 MED ORDER — ONDANSETRON HCL 4 MG/2ML IJ SOLN
4.0000 mg | Freq: Once | INTRAMUSCULAR | Status: AC
  Administered 2015-05-19: 4 mg via INTRAVENOUS
  Filled 2015-05-19: qty 2

## 2015-05-19 MED ORDER — MORPHINE SULFATE (PF) 4 MG/ML IV SOLN
4.0000 mg | Freq: Once | INTRAVENOUS | Status: AC
  Administered 2015-05-19: 4 mg via INTRAVENOUS
  Filled 2015-05-19: qty 1

## 2015-05-19 MED ORDER — IOHEXOL 300 MG/ML  SOLN
50.0000 mL | Freq: Once | INTRAMUSCULAR | Status: AC | PRN
  Administered 2015-05-19: 50 mL via ORAL

## 2015-05-19 NOTE — ED Provider Notes (Signed)
CSN: [id number]     Arrival date & time 05/19/15  2152 History   First MD Initiated Contact with Patient 05/19/15 2218     Chief Complaint  Patient presents with  . Abdominal Pain     (Consider location/radiation/quality/duration/timing/severity/associated sxs/prior Treatment) The history is provided by the patient and medical records.   24 year old female with history of chronic headaches, asthma, seasonal allergies, presenting to the ED for abdominal pain. Patient states she isn't having left-sided abdominal pain for the past 3 days. She states pain has been progressively worsening. She states she was treated for UTI 2 weeks ago, completed 1 course of antibiotics and was then started on another. She states her urinary symptoms seem to have resolved. States pain is left upper and lower abdomen, states sharp in nature.  She reports associated nausea but denies vomiting. She has been able to drink normally today which does not seem to make her pain better or worse. [facility] and had labs and abdominal x-ray performed which showed a "blockage". She states she was told to take milk of magnesia and if he did not get better to come to the emergency department. Patient states she ate lunch prior to taking the medication, however symptoms seem worse afterwards.  She denies any fever or chills. She has had no difficulty with bowel movements recently. Patient has no history of GI issues. No prior abdominal surgeries.  Past Medical History  Diagnosis Date  . Bronchitis   . Chronic headaches   . Asthma   . Allergy    Past Surgical History  Procedure Laterality Date  . Tonsillectomy and adenoidectomy  1995  . Tonsillectomy and adenoidectomy     Family History  Problem Relation Age of Onset  . Emphysema Maternal Grandmother     smoker  . Breast cancer Maternal Grandmother   . Allergies Mother   . Allergies Sister   . Heart disease Father   .  Diabetes Father   . Hyperlipidemia Father   . Hypertension Father   . Stroke Father   . Skin cancer Maternal Grandfather   . Heart disease Maternal Grandfather   . Heart disease Paternal Grandfather    Social History  Substance Use Topics  . Smoking status: Never Smoker   . Smokeless tobacco: Never Used  . Alcohol Use: Yes     Comment: once a week   OB History    No data available     Review of Systems  Gastrointestinal: Positive for nausea and abdominal pain.  All other systems reviewed and are negative.     Allergies  Prednisone and Tetracyclines & related  Home Medications   Prior to Admission medications   Medication Sig Start Date End Date Taking? Authorizing Provider  albuterol (PROAIR HFA) 108 (90 BASE) MCG/ACT inhaler Inhale 2 puffs into the lungs every 6 (six) hours as needed for wheezing or shortness of breath.    Yes Historical Provider, MD  beclomethasone (QVAR) 40 MCG/ACT inhaler Take 2 puffs first thing in am and then another 2 puffs about 12 hours later. 01/10/15  Yes Nyoka CowdenMichael B Wert, MD  Cranberry 125 MG TABS Take 125 mg by mouth daily.    Yes Historical Provider, MD  norethindrone-ethinyl estradiol (MICROGESTIN,JUNEL,LOESTRIN) 1-20 MG-MCG tablet Take 1 tablet by mouth daily. 11/24/13  Yes Historical Provider, MD  famotidine (PEPCID) 20 MG tablet One at bedtime Patient not taking: Reported on 05/19/2015 05/29/14   Casimiro NeedleMichael  Denice Paradise, MD  montelukast (SINGULAIR) 10 MG tablet Take 1 tablet (10 mg total) by mouth at bedtime. Patient not taking: Reported on 05/19/2015 01/10/15   Nyoka Cowden, MD  pantoprazole (PROTONIX) 40 MG tablet Take 1 tablet (40 mg total) by mouth daily. Patient not taking: Reported on 05/19/2015 11/11/14   Nyoka Cowden, MD   BP 127/75 mmHg  Pulse 76  Temp(Src) 98.1 F (36.7 C) (Oral)  Resp 14  Ht  (1.6 m)  Wt 120 lb (54.432 kg)  BMI 21.26 kg/m2  SpO2 100%  LMP 04/27/2015 (Approximate)   Physical Exam  Constitutional: She is  oriented to person, place, and time. She appears well-developed and well-nourished. No distress.  HENT:  Head: Normocephalic and atraumatic.  Mouth/Throat: Oropharynx is clear and moist.  Eyes: Conjunctivae and EOM are normal. Pupils are equal, round, and reactive to light.  Neck: Normal range of motion.  Cardiovascular: Normal rate, regular rhythm and normal heart sounds.   Pulmonary/Chest: Effort normal and breath sounds normal. No respiratory distress. She has no wheezes.  Abdominal: Soft. Bowel sounds are normal. There is tenderness in the left upper quadrant and left lower quadrant. There is no guarding.  Musculoskeletal: Normal range of motion.  Neurological: She is alert and oriented to person, place, and time.  Skin: Skin is warm and dry. She is not diaphoretic.  Psychiatric: She has a normal mood and affect.  Nursing note and vitals reviewed.   ED Course  Procedures (including critical care time) Labs Review Labs Reviewed  COMPREHENSIVE METABOLIC PANEL - Abnormal; Notable for the following:    ALT 13 (*)    All other components within normal limits  URINALYSIS, ROUTINE W REFLEX MICROSCOPIC (NOT AT [facility]) - Abnormal; Notable for the following:    Ketones, ur 15 (*)    All other components within normal limits  LIPASE, BLOOD  CBC  I-STAT BETA HCG BLOOD, ED ([facility], [facility], [facility] ONLY)    Imaging Review No results found. I have personally reviewed and evaluated these images and lab results as part of my medical decision-making.   EKG Interpretation None      MDM   Final diagnoses:  Abdominal pain, unspecified abdominal location  Nausea   24 year old female here with left-sided abdominal pain for the past 3 days. Recent treatment for UTI, however denies current urinary symptoms. Reports nausea without vomiting. Patient is afebrile, nontoxic. She has tenderness in her left upper and lower quadrants without peritonitis. [facility] and had x-ray  performed which showed "blockage" however patient does not know if this was bowel obstruction or not and no labs/imaging available to review in EPIC.  Repeat labs here are reassuring.  U/a without signs of infection.  Given questionable "blockage" on plain films, CT scan will be obtained.  Patient given morphine/zofran for symptom control.  1:10 AM Care signed out to PA [facility] at shift change.  CT pending.  If negative, feel patient is appropriate for discharge home with symptomatic care.  Garlon Hatchet, PA-C 05/20/15 0111  Raeford Razor, MD 05/20/15 [phone]

## 2015-05-19 NOTE — ED Notes (Signed)
Pt states she has been having LUQ pain since Friday  Pt states she was treated for a UTI 2 weeks ago and just finished a round of antibiotics and they did not help so she was started on another one  Pt [facility] and they did labs and xrays and told her she had a partial blockage instructed her to take MOM and if she did not get relief to come to the ED  Pt states they pain actually got worse  Pt states her last bowel movement was this morning  Pt states she was nauseated this morning

## 2015-05-20 ENCOUNTER — Encounter (HOSPITAL_COMMUNITY): Payer: Self-pay

## 2015-05-20 LAB — URINALYSIS, ROUTINE W REFLEX MICROSCOPIC
Bilirubin Urine: NEGATIVE
Glucose, UA: NEGATIVE mg/dL
Hgb urine dipstick: NEGATIVE
Ketones, ur: 15 mg/dL — AB
Leukocytes, UA: NEGATIVE
Nitrite: NEGATIVE
Protein, ur: NEGATIVE mg/dL
Specific Gravity, Urine: 1.006 (ref 1.005–1.030)
Urobilinogen, UA: 0.2 mg/dL (ref 0.0–1.0)
pH: 7.5 (ref 5.0–8.0)

## 2015-05-20 MED ORDER — ONDANSETRON 4 MG PO TBDP: 4.0000 mg | ORAL_TABLET | Freq: Three times a day (TID) | ORAL | Status: DC | PRN

## 2015-05-20 MED ORDER — DICYCLOMINE HCL 20 MG PO TABS: 20.0000 mg | ORAL_TABLET | Freq: Two times a day (BID) | ORAL | Status: DC

## 2015-05-20 MED ORDER — IOHEXOL 300 MG/ML  SOLN
100.0000 mL | Freq: Once | INTRAMUSCULAR | Status: AC | PRN
  Administered 2015-05-20: 80 mL via INTRAVENOUS

## 2015-05-20 NOTE — ED Provider Notes (Signed)
1:12 AM Patient signed out to me by Sharilyn Sites, PA-C. Patient pending CT abdomen pelvis results.   1:31 AM Patient's CT unremarkable for acute changes. Patient will be discharged with recommended PCP follow up. Vitals stable and patient afebrile.   Results for orders placed or performed during the hospital encounter of 05/19/15  Lipase, blood  Result Value Ref Range   Lipase 37 11 - 51 U/L  Comprehensive metabolic panel  Result Value Ref Range   Sodium 136 135 - 145 mmol/L   Potassium 3.8 3.5 - 5.1 mmol/L   Chloride 105 101 - 111 mmol/L   CO2 25 22 - 32 mmol/L   Glucose, Bld 81 65 - 99 mg/dL   BUN 10 6 - 20 mg/dL   Creatinine, Ser 1.61 0.44 - 1.00 mg/dL   Calcium 9.2 8.9 - 09.6 mg/dL   Total Protein 7.9 6.5 - 8.1 g/dL   Albumin 4.4 3.5 - 5.0 g/dL   AST 27 15 - 41 U/L   ALT 13 (L) 14 - 54 U/L   Alkaline Phosphatase 47 38 - 126 U/L   Total Bilirubin 0.7 0.3 - 1.2 mg/dL   GFR calc non Af Amer >60 >60 mL/min   GFR calc Af Amer >60 >60 mL/min   Anion gap 6 5 - 15  CBC  Result Value Ref Range   WBC 6.1 4.0 - 10.5 K/uL   RBC 5.06 3.87 - 5.11 MIL/uL   Hemoglobin 14.8 12.0 - 15.0 g/dL   HCT 04.5 40.9 - 81.1 %   MCV 87.5 78.0 - 100.0 fL   MCH 29.2 26.0 - 34.0 pg   MCHC 33.4 30.0 - 36.0 g/dL   RDW 91.4 78.2 - 95.6 %   Platelets 213 150 - 400 K/uL  Urinalysis, Routine w reflex microscopic (not at [facility])  Result Value Ref Range   Color, Urine YELLOW YELLOW   APPearance CLEAR CLEAR   Specific Gravity, Urine 1.006 1.005 - 1.030   pH 7.5 5.0 - 8.0   Glucose, UA NEGATIVE NEGATIVE mg/dL   Hgb urine dipstick NEGATIVE NEGATIVE   Bilirubin Urine NEGATIVE NEGATIVE   Ketones, ur 15 (A) NEGATIVE mg/dL   Protein, ur NEGATIVE NEGATIVE mg/dL   Urobilinogen, UA 0.2 0.0 - 1.0 mg/dL   Nitrite NEGATIVE NEGATIVE   Leukocytes, UA NEGATIVE NEGATIVE  I-Stat beta hCG blood, ED ([facility], [facility], [facility] only)  Result Value Ref Range   I-stat hCG, quantitative <5.0 <5 mIU/mL   Comment 3           Ct Abdomen  Pelvis W Contrast  05/20/2015  CLINICAL DATA:  Left upper quadrant pain for 4 days EXAM: CT ABDOMEN AND PELVIS WITH CONTRAST TECHNIQUE: Multidetector CT imaging of the abdomen and pelvis was performed using the standard protocol following bolus administration of intravenous contrast. CONTRAST:  50mL OMNIPAQUE IOHEXOL 300 MG/ML SOLN, 80mL OMNIPAQUE IOHEXOL 300 MG/ML SOLN COMPARISON:  None. FINDINGS: Lung bases are free of acute infiltrate or sizable effusion. The liver, gallbladder, spleen, adrenal glands and pancreas are all normal in their CT appearance. The kidneys are well visualized bilaterally and reveal a normal enhancement pattern. No renal calculi or obstructive changes are noted. The appendix is well visualized. Fluid is noted throughout the small bowel and colon without obstructive change. No changes to suggest diverticulitis are seen. No vascular abnormality is noted. Pelvis shows the bladder to be well distended. No pelvic mass lesion or sidewall abnormality is noted. Bony structures are within normal limits. IMPRESSION:  No acute abnormality noted. Electronically Signed   By: Alcide CleverMark  Lukens M.D.   On: 05/20/2015 01:18      Emilia BeckKaitlyn Evelen Vazguez, PA-C 05/20/15 0132  Lyndal Pulleyaniel Knott, MD 05/20/15 [phone]

## 2015-05-20 NOTE — Discharge Instructions (Signed)
Take the prescribed medication as directed. Follow-up with your doctor. Return to the ED for new or worsening symptoms. 

## 2016-01-14 ENCOUNTER — Ambulatory Visit (INDEPENDENT_AMBULATORY_CARE_PROVIDER_SITE_OTHER): Payer: BLUE CROSS/BLUE SHIELD | Admitting: Internal Medicine

## 2016-01-14 ENCOUNTER — Encounter: Payer: Self-pay | Admitting: Internal Medicine

## 2016-01-14 VITALS — BP 104/70 | HR 50 | Ht 65.0 in | Wt 134.0 lb

## 2016-01-14 DIAGNOSIS — J45991 Cough variant asthma: Secondary | ICD-10-CM

## 2016-01-14 MED ORDER — ALBUTEROL SULFATE HFA 108 (90 BASE) MCG/ACT IN AERS: 2.0000 | INHALATION_SPRAY | Freq: Four times a day (QID) | RESPIRATORY_TRACT | Status: DC | PRN

## 2016-01-14 MED ORDER — BECLOMETHASONE DIPROPIONATE 40 MCG/ACT IN AERS: INHALATION_SPRAY | RESPIRATORY_TRACT | Status: DC

## 2016-01-14 NOTE — Assessment & Plan Note (Signed)
-  hfa 90% 06/29/12> rx symbicort 80 > resolved  - spirometry wnl  04/06/2013  - allergy profile 11/30/2013 >  IgE  24.2 with dust POS RAST/ neg eos -  05/29/2014 flared on symbicort 80 and singulair > stop both and start qvar 80 one bid and gerd rx  - 07/26/14 better on just qvar 80 one bid  -  Cough exac on qvar 80 2bid so rec qvar 40 2bid > improved at ov 01/02/2015   All goals of chronic asthma control met including optimal function and elimination of symptoms with minimal need for rescue therapy.  Contingencies discussed in full including contacting this office immediately if not controlling the symptoms using the rule of two's.

## 2016-01-14 NOTE — Patient Instructions (Addendum)
For drainage / throat tickle try take CHLORPHENIRAMINE  4 mg - take one every 4 hours as needed - available over the counter- may cause drowsiness so start with just a bedtime dose or two and see how you tolerate it before trying in daytime     If you are satisfied with your treatment plan,  let your doctor know and he/she can either refill your medications or you can return here when your prescription runs out.     If in any way you are not 100% satisfied,  please tell us.  If 100% better, tell your friends!  Pulmonary follow up is as needed   

## 2016-01-14 NOTE — Progress Notes (Signed)
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 Cough is mostly dry, worse when lies down unless takes codeine containing cough syrups.  rec Symbicort 80 Take 2 puffs first thing in am and then another 2 puffs about 12 hours later.  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 Helped) Chief Complaint  Patient presents with  . Follow-up    Pt states following up early due to insurance change. She c/o PND but denies a cough. She has not had to use albuterol recently.   Still has daily sense of pnds controlled with chlortrimeton, no noct flares or resp problems sleeping  rec Ok to try off protonix and pepcid for now but restart right away for any flare  As needed For drainage take chlortrimeton (chlorpheniramine) 4 mg every 4 hours available over the counter (may cause drowsiness)    01/14/2016  f/u ov/Fahad Cisse re:  Cough variant asthma on qvar 40 bid Off gerd rx/ takes singulair during the allergy seasons only No trouble with exercise/sleep and no "winter bronchitis" this year    No obvious day to day or daytime variabilty or assoc chronic cough or cp or chest tightness, subjective wheeze overt sinus or hb symptoms. No unusual exp hx or h/o childhood pna/ asthma or knowledge of  premature birth.  Sleeping ok without nocturnal  or early am exacerbation  of respiratory  c/o's or need for noct saba. Also denies any obvious fluctuation of symptoms with weather or environmental changes or other aggravating or alleviating factors except as outlined above   Current Medications, Allergies, Complete Past Medical History, Past Surgical History, Family History, and Social History were reviewed in [facility] electronic medical record.  ROS  The following are not active complaints unless bolded sore throat, dysphagia, dental problems, itching, sneezing,  nasal congestion or excess/ purulent secretions, ear ache,   fever, chills, sweats, unintended wt loss, pleuritic or exertional cp, hemoptysis,  orthopnea pnd or leg swelling, presyncope, palpitations, heartburn, abdominal pain, anorexia, nausea, vomiting, diarrhea  or change in bowel or urinary habits, change in stools or urine, dysuria,hematuria,  rash, arthralgias, visual complaints, headache, numbness weakness or ataxia or problems with walking or coordination,  change in mood/affect or memory.             Objective:   Physical Exam  11/30/13  130 > 05/29/2014  135 > 07/26/2014    131 >128 08/27/2014 > 11/11/2014   127 >126 11/29/2014> 01/02/2015   134   amb wf nad/ very healthy appearing   Vital signs reviewed   HEENT: nl dentition,   oropharynx  . Nl external ear canals without cough reflex - moderate bilateral non-specific turbinate edema     NECK :  without JVD/Nodes/TM/ nl carotid upstrokes bilaterally,    LUNGS: no acc muscle use, clear to A and P bilaterally without cough on insp or exp maneuvers   CV:  RRR  no s3 or murmur or increase in P2, no edema   ABD:  soft and nontender with nl excursion in the supine position. No bruits or organomegaly, bowel sounds nl  MS:  warm without deformities, calf tenderness, cyanosis or clubbing  SKIN: warm and dry without lesions  , no rashes            Assessment &  Plan:

## 2016-06-02 ENCOUNTER — Encounter: Payer: Self-pay | Admitting: Internal Medicine

## 2016-06-02 ENCOUNTER — Ambulatory Visit (INDEPENDENT_AMBULATORY_CARE_PROVIDER_SITE_OTHER): Payer: PRIVATE HEALTH INSURANCE | Admitting: Internal Medicine

## 2016-06-02 VITALS — BP 114/76 | HR 80 | Temp 98.0°F | Ht 65.0 in | Wt 132.0 lb

## 2016-06-02 DIAGNOSIS — R05 Cough: Secondary | ICD-10-CM

## 2016-06-02 DIAGNOSIS — J45991 Cough variant asthma: Secondary | ICD-10-CM | POA: Diagnosis not present

## 2016-06-02 DIAGNOSIS — R058 Other specified cough: Secondary | ICD-10-CM

## 2016-06-02 LAB — NITRIC OXIDE: Nitric Oxide: 12

## 2016-06-02 MED ORDER — PANTOPRAZOLE SODIUM 40 MG PO TBEC: 40.0000 mg | DELAYED_RELEASE_TABLET | Freq: Every day | ORAL | 2 refills | Status: DC

## 2016-06-02 MED ORDER — ACETAMINOPHEN-CODEINE #3 300-30 MG PO TABS: ORAL_TABLET | ORAL | 0 refills | Status: DC

## 2016-06-02 MED ORDER — METHYLPREDNISOLONE ACETATE 80 MG/ML IJ SUSP
120.0000 mg | Freq: Once | INTRAMUSCULAR | Status: AC
  Administered 2016-06-02: 120 mg via INTRAMUSCULAR

## 2016-06-02 MED ORDER — FAMOTIDINE 20 MG PO TABS: ORAL_TABLET | ORAL | 2 refills | Status: DC

## 2016-06-02 NOTE — Assessment & Plan Note (Addendum)
-   hfa 90% 06/29/12> rx symbicort 80 > resolved  - spirometry wnl  04/06/2013  - allergy profile 11/30/2013 >  IgE  24.2 with dust POS RAST/ neg eos -  05/29/2014 flared on symbicort 80 and singulair > stop both and start qvar 80 one bid and gerd rx  - 07/26/14 better on just qvar 80 one bid  -  Cough exac on qvar 80 2bid so rec qvar 40 2bid > improved at ov 01/02/2015  - FENO 06/02/2016  =  12 on qvar 40 2bid during flare of cough off gerd rx/ on bcps / p uri   No evidence of significant asthma cough > no change rx       rec max rx for gerd /cyclical cough and no change in qvar / singulair during allergy season only

## 2016-06-02 NOTE — Assessment & Plan Note (Signed)
Note flared p uri and off gerd rx/ on bcps  Of the three most common causes of chronic cough, only one (GERD)  can actually cause the other two (asthma and post nasal drip syndrome)  and perpetuate the cylce of cough inducing airway trauma, inflammation, heightened sensitivity to reflux which is prompted by the cough itself via a cyclical mechanism.    This may partially respond to steroids and look like asthma and post nasal drainage but never erradicated completely unless the cough and the secondary reflux are eliminated, preferably both at the same time.  While not intuitively obvious, many patients with chronic low grade reflux do not cough until there is a secondary insult that disturbs the protective epithelial barrier and exposes sensitive nerve endings.  This can be viral(as likely is the case here)  or direct physical injury such as with an endotracheal tube.   The point is that once this occurs, it is difficult to eliminate using anything but a maximally effective acid suppression regimen at least in the short run, accompanied by an appropriate diet to address non acid GERD.  For now work on eliminating gerd/ cyclical cough and f/u in 2 weeks prn   I had an extended discussion with the patient reviewing all relevant studies completed to date and  lasting 15 to 20 minutes of a 25 minute visit    Each maintenance medication was reviewed in detail including most importantly the difference between maintenance and prns and under what circumstances the prns are to be triggered using an action plan format that is not reflected in the computer generated alphabetically organized AVS.    Please see instructions for details which were reviewed in writing and the patient given a copy highlighting the part that I personally wrote and discussed at today's ov.

## 2016-06-02 NOTE — Patient Instructions (Addendum)
Pantoprazole (protonix) 40 mg   Take  30-60 min before first meal of the day and Pepcid (famotidine)  20 mg one @  bedtime until no cough for a week or need for cough med  Depomedrol 120 mg IM   For drainage / throat tickle try take CHLORPHENIRAMINE  4 mg - take one every 4 hours as needed - available over the counter- may cause drowsiness so start with just a bedtime dose or two and see how you tolerate it before trying in daytime    Delsym 2 tsp every 12 hours as needed and tylenol #3 if still coughing with goal of no cough x 3 days  GERD (REFLUX)  is an extremely common cause of respiratory symptoms just like yours , many times with no obvious heartburn at all.    It can be treated with medication, but also with lifestyle changes including elevation of the head of your bed (ideally with 6 inch  bed blocks),  Smoking cessation, avoidance of late meals, excessive alcohol, and avoid fatty foods, chocolate, peppermint, colas, red wine, and acidic juices such as orange juice.  NO MINT OR MENTHOL PRODUCTS SO NO COUGH DROPS  USE SUGARLESS CANDY INSTEAD (Jolley ranchers or Stover's or Life Savers) or even ice chips will also do - the key is to swallow to prevent all throat clearing. NO OIL BASED VITAMINS - use powdered substitutes.   The progesterone component of your birth control may be contributing to reflux induced coughing and may need to be reduced if feasible   Return if not completely better in 2 weeks

## 2016-06-02 NOTE — Addendum Note (Signed)
Addended by: Maxwell MarionBLANKENSHIP, MARGIE A on: 06/02/2016 09:48 AM   Modules accepted: Orders

## 2016-06-02 NOTE — Progress Notes (Signed)
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 Cough is mostly dry, worse when lies down unless takes codeine containing cough syrups.  rec Symbicort 80 Take 2 puffs first thing in am and then another 2 puffs about 12 hours later.  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 Helped) Chief Complaint  Patient presents with  . Follow-up    Pt states following up early due to insurance change. She c/o PND but denies a cough. She has not had to use albuterol recently.   Still has daily sense of pnds controlled with chlortrimeton, no noct flares or resp problems sleeping  rec Ok to try off protonix and pepcid for now but restart right away for any flare  As needed For drainage take chlortrimeton (chlorpheniramine) 4 mg every 4 hours available over the counter (may cause drowsiness)    01/14/2016  f/u ov/Megan Holland re:  Cough variant asthma on qvar 40 bid Off gerd rx/ takes singulair during the allergy seasons only No trouble with exercise/sleep and no "winter bronchitis" this year  rec For drainage / throat tickle try take CHLORPHENIRAMINE  4 mg - take one every 4 hours as needed - available over the counter- may cause drowsiness so start with just a bedtime dose or two and see how you tolerate it before trying in daytime   F/u prn     06/02/2016 acute extended ov/Megan Holland re: cough variant asthma/ cough x one month  Chief Complaint  Patient presents with  . Acute Visit    Pt c/o cough for the past 3-4 wks. Cough is occ prod with clear sputum. Cough seems esp worse at night. She had some chest tightness last wk- used albuterol and this helped.    acutely with uri/ stuffy head same illness as boyfriend  x4 weeks prior to OV  And all symptoms resolved x for the mostly dry cough with some throat irritation p coughing fit     No obvious day to day or daytime variabilty or assoc excess/ purulent sputum or mucus plugs   or cp or chest tightness, subjective wheeze overt sinus or hb symptoms. No unusual exp hx or h/o childhood pna/ asthma or knowledge of premature birth.  Sleeping ok without nocturnal  or early am exacerbation  of respiratory  c/o's or need for noct saba. Also denies any obvious fluctuation of symptoms with weather or environmental changes or other aggravating or alleviating factors except as outlined above   Current Medications, Allergies, Complete Past Medical History, Past Surgical History, Family History, and Social History were reviewed in [facility] electronic medical record.  ROS  The following are not active complaints unless bolded sore throat, dysphagia, dental problems, itching, sneezing,  nasal congestion or excess/ purulent secretions, ear ache,   fever,  chills, sweats, unintended wt loss, pleuritic or exertional cp, hemoptysis,  orthopnea pnd or leg swelling, presyncope, palpitations, heartburn, abdominal pain, anorexia, nausea, vomiting, diarrhea  or change in bowel or urinary habits, change in stools or urine, dysuria,hematuria,  rash, arthralgias, visual complaints, headache, numbness weakness or ataxia or problems with walking or coordination,  change in mood/affect or memory.             Objective:   Physical Exam  11/30/13  130 > 05/29/2014  135 > 07/26/2014    131 >128 08/27/2014 > 11/11/2014    127 >126 11/29/2014> 01/02/2015   134 > 06/02/2016  132  amb wf constant hacking cough   Vital signs reviewed   HEENT: nl dentition,   oropharynx  . Nl external ear canals without cough reflex - mild  bilateral non-specific turbinate edema     NECK :  without JVD/Nodes/TM/ nl carotid upstrokes bilaterally,    LUNGS: no acc muscle use, clear to A and P bilaterally without cough on insp or exp maneuvers   CV:  RRR  no s3 or murmur or increase in P2, no edema   ABD:  soft and nontender with nl excursion in the supine position. No bruits or organomegaly, bowel sounds nl  MS:  warm without deformities, calf tenderness, cyanosis or clubbing  SKIN: warm and dry without lesions  , no rashes            Assessment & Plan:

## 2016-06-15 ENCOUNTER — Encounter: Payer: Self-pay | Admitting: Internal Medicine

## 2016-06-18 ENCOUNTER — Ambulatory Visit (INDEPENDENT_AMBULATORY_CARE_PROVIDER_SITE_OTHER)
Admission: RE | Admit: 2016-06-18 | Discharge: 2016-06-18 | Disposition: A | Discharge status: Home / Self Care | Payer: PRIVATE HEALTH INSURANCE | Source: Ambulatory Visit | Attending: Internal Medicine | Admitting: Internal Medicine

## 2016-06-18 ENCOUNTER — Encounter: Payer: Self-pay | Admitting: Internal Medicine

## 2016-06-18 ENCOUNTER — Ambulatory Visit (INDEPENDENT_AMBULATORY_CARE_PROVIDER_SITE_OTHER): Payer: PRIVATE HEALTH INSURANCE | Admitting: Internal Medicine

## 2016-06-18 ENCOUNTER — Encounter: Payer: Self-pay | Admitting: *Deleted

## 2016-06-18 VITALS — BP 108/74 | HR 110 | Ht 65.0 in | Wt 132.8 lb

## 2016-06-18 DIAGNOSIS — R05 Cough: Secondary | ICD-10-CM

## 2016-06-18 DIAGNOSIS — J45991 Cough variant asthma: Secondary | ICD-10-CM

## 2016-06-18 DIAGNOSIS — R058 Other specified cough: Secondary | ICD-10-CM

## 2016-06-18 MED ORDER — FLUTTER DEVI: 0 refills | Status: AC

## 2016-06-18 MED ORDER — METHYLPREDNISOLONE 4 MG PO TABS: ORAL_TABLET | ORAL | 0 refills | Status: DC

## 2016-06-18 MED ORDER — AMOXICILLIN-POT CLAVULANATE 875-125 MG PO TABS: 1.0000 | ORAL_TABLET | Freq: Two times a day (BID) | ORAL | 0 refills | Status: AC

## 2016-06-18 NOTE — Progress Notes (Signed)
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 Cough is mostly dry, worse when lies down unless takes codeine containing cough syrups.  rec Symbicort 80 Take 2 puffs first thing in am and then another 2 puffs about 12 hours later.  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 Helped) Chief Complaint  Patient presents with  . Follow-up    Pt states following up early due to insurance change. She c/o PND but denies a cough. She has not had to use albuterol recently.   Still has daily sense of pnds controlled with chlortrimeton, no noct flares or resp problems sleeping  rec Ok to try off protonix and pepcid for now but restart right away for any flare  As needed For drainage take chlortrimeton (chlorpheniramine) 4 mg every 4 hours available over the counter (may cause drowsiness)    01/14/2016  f/u ov/Wert re:  Cough variant asthma on qvar 40 bid Off gerd rx/ takes singulair during the allergy seasons only No trouble with exercise/sleep and no "winter bronchitis" this year  rec For drainage / throat tickle try take CHLORPHENIRAMINE  4 mg - take one every 4 hours as needed - available over the counter- may cause drowsiness so start with just a bedtime dose or two and see how you tolerate it before trying in daytime   F/u prn     06/02/2016 acute extended ov/Wert re: cough variant asthma/ cough x one month  Chief Complaint  Patient presents with  . Acute Visit    Pt c/o cough for the past 3-4 wks. Cough is occ prod with clear sputum. Cough seems esp worse at night. She had some chest tightness last wk- used albuterol and this helped.    acute onset  with uri/ stuffy head same illness as boyfriend  X 4 weeks prior to OV  And all symptoms resolved x for the mostly dry cough with some throat irritation p coughing fit   rec Pantoprazole (protonix) 40 mg   Take  30-60 min before first meal of the day and Pepcid (famotidine)  20 mg one @  bedtime until no cough for a week or need for cough med Depomedrol 120 mg IM  For drainage / throat tickle try take CHLORPHENIRAMINE  4 mg - take one every 4 hours as needed - available over the counter- may cause drowsiness so start with just a bedtime dose or two and see how you tolerate it before trying in daytime   Delsym 2 tsp every 12 hours as needed and tylenol #3 if still coughing with goal of no cough x 3 days GERD diet  The progesterone component of your birth control may be contributing to reflux induced coughing and may need to be reduced if feasible  06/18/2016  f/u ov/Wert re: persistent cough x early Nov 2017  Chief Complaint  Patient presents with  . Follow-up    Cough worse, esp at night to the point of gasping for breath. Cough is occ prod with minimal green sputum.    only used 13 tyl #3 since last ov Traces of green mucus in am but mostly harsh/non-productive coughing fits day > noct     No obvious day to day or daytime variabilty or assoc e  mucus plugs  /hemoptysis or cp or chest tightness, subjective wheeze overt sinus or hb symptoms. No unusual exp hx or h/o childhood pna/ asthma or knowledge of premature birth.  Sleeping ok without nocturnal  or early am exacerbation  of respiratory  c/o's or need for noct saba. Also denies any obvious fluctuation of  symptoms with weather or environmental changes or other aggravating or alleviating factors except as outlined above   Current Medications, Allergies, Complete Past Medical History, Past Surgical History, Family History, and Social History were reviewed in [facility] electronic medical record.  ROS  The following are not active complaints unless bolded sore throat, dysphagia, dental problems, itching, sneezing,  nasal congestion or excess/ purulent secretions, ear ache,   fever, chills, sweats, unintended wt loss, pleuritic or exertional cp, hemoptysis,  orthopnea pnd or leg swelling, presyncope, palpitations, heartburn, abdominal pain, anorexia, nausea, vomiting, diarrhea  or change in bowel or urinary habits, change in stools or urine, dysuria,hematuria,  rash, arthralgias, visual complaints, headache, numbness weakness or ataxia or problems with walking or coordination,  change in mood/affect or memory.             Objective:   Physical Exam  11/30/13  130 > 05/29/2014  135 > 07/26/2014    131 >128 08/27/2014 > 11/11/2014   127 >126 11/29/2014> 01/02/2015   134 > 06/02/2016  132 > 06/18/2016  132   amb wf constant honking dry upper airway pattern coughing    Vital signs reviewed - Note on arrival 02 sats  100% on RA    HEENT: nl dentition,   oropharynx  . Nl external ear canals without cough reflex - mild  bilateral non-specific turbinate edema     NECK :  without JVD/Nodes/TM/ nl carotid upstrokes bilaterally,    LUNGS: no acc muscle use, clear to A and P bilaterally without cough on insp or exp maneuvers   CV:  RRR  no s3 or murmur or increase in P2, no edema   ABD:  soft and nontender with nl excursion in the supine position. No bruits or organomegaly, bowel sounds nl  MS:  warm without deformities, calf tenderness, cyanosis or clubbing  SKIN: warm and dry without lesions  , no rashes       CXR PA and Lateral:   06/18/2016 :    I personally reviewed images and agree with  radiology impression as follows:    No active cardiopulmonary disease.      Assessment & Plan:

## 2016-06-18 NOTE — Progress Notes (Signed)
Spoke with pt and notified of results per Dr. Wert. Pt verbalized understanding and denied any questions. 

## 2016-06-18 NOTE — Patient Instructions (Addendum)
Augmentin 875 mg take one pill twice daily  X 10 days - take at breakfast and supper with large glass of water.  It would help reduce the usual side effects (diarrhea and yeast infections) if you ate cultured yogurt at lunch.   Medrol 4 mg 4 x 2days , 2 x 2days  and 1 x 2 days and stop   Be sure you take Pepcid 20 mg at bedtime   Take delsym two tsp every 12 hours and supplement if needed with tylenol #3  up to 2 every 4 hours to suppress the urge to cough. Swallowing water or using ice chips/non mint and menthol containing candies (such as lifesavers or sugarless jolly ranchers) are also effective.  You should rest your voice and avoid activities that you know make you cough.  Once you have eliminated the cough for 3 straight days try reducing the tylenol #3 first,  then the delsym as tolerated.    Always cough into the flutter valve to prevent airway trauma from coughing  Please remember to go to the x-ray department downstairs for your tests - we will call you with the results when they are available.  You will need a work not for this weekend as the cough won't stop until you stop coughing.   Return in 2 weeks if not 100% better

## 2016-06-20 NOTE — Assessment & Plan Note (Signed)
Upper airway cough syndrome (previously labeled PNDS) , is  so named because it's frequently impossible to sort out how much is  CR/sinusitis with freq throat clearing (which can be related to primary GERD)   vs  causing  secondary (" extra esophageal")  GERD from wide swings in gastric pressure that occur with throat clearing, often  promoting self use of mint and menthol lozenges that reduce the lower esophageal sphincter tone and exacerbate the problem further in a cyclical fashion.   These are the same pts (now being labeled as having "irritable larynx syndrome" by some cough centers) who not infrequently have a history of having failed to tolerate ace inhibitors,  dry powder inhalers or biphosphonates or report having atypical/extraesophageal reflux symptoms that don't respond to standard doses of PPI  and are easily confused as having aecopd or asthma flares by even experienced allergists/ pulmonologists (myself included).   Of the three most common causes of chronic cough, only one (GERD)  can actually cause the other two (asthma and post nasal drip syndrome)  and perpetuate the cylce of cough inducing airway trauma, inflammation, heightened sensitivity to reflux which is prompted by the cough itself via a cyclical mechanism.    This may partially respond to steroids and look like asthma and post nasal drainage but never erradicated completely unless the cough and the secondary reflux are eliminated, preferably both at the same time.  While not intuitively obvious, many patients with chronic low grade reflux do not cough until there is a secondary insult that disturbs the protective epithelial barrier and exposes sensitive nerve endings.  This can be viral as may be the case here or direct physical injury such as with an endotracheal tube.   The point is that once this occurs, it is difficult to eliminate using anything but a maximally effective acid suppression regimen at least in the short run,  accompanied by an appropriate diet to address non acid GERD.   rec  Max gerd rx and eliminate cyclical cough with flutter/ tylenol #3 / short course medrol and augmentin in case there is assoc active sinus dz  I had an extended discussion with the patient reviewing all relevant studies completed to date and  lasting 15 to 20 minutes of a 25 minute visit    Each maintenance medication was reviewed in detail including most importantly the difference between maintenance and prns and under what circumstances the prns are to be triggered using an action plan format that is not reflected in the computer generated alphabetically organized AVS.    Please see AVS for unique instructions that I personally wrote and verbalized to the the pt in detail and then reviewed with pt  by my nurse highlighting any  changes in therapy recommended at today's visit to their plan of care.

## 2016-06-20 NOTE — Assessment & Plan Note (Signed)
-   hfa 90% 06/29/12> rx symbicort 80 > resolved  - spirometry wnl  04/06/2013  - allergy profile 11/30/2013 >  IgE  24.2 with dust POS RAST/ neg eos -  05/29/2014 flared on symbicort 80 and singulair > stop both and start qvar 80 one bid and gerd rx  - 07/26/14 better on just qvar 80 one bid  -  Cough exac on qvar 80 2bid so rec qvar 40 2bid > improved at ov 01/02/2015  - FENO 06/02/2016  =  12 on qvar 40 2bid during flare of cough off gerd rx/ on bcps / p uri    No evidence of asthma flare at this point > no change in Rx needed

## 2017-03-23 ENCOUNTER — Encounter: Payer: Self-pay | Admitting: Pulmonary Disease

## 2017-03-23 ENCOUNTER — Ambulatory Visit (INDEPENDENT_AMBULATORY_CARE_PROVIDER_SITE_OTHER): Payer: PRIVATE HEALTH INSURANCE | Admitting: Pulmonary Disease

## 2017-03-23 DIAGNOSIS — R058 Other specified cough: Secondary | ICD-10-CM

## 2017-03-23 DIAGNOSIS — R05 Cough: Secondary | ICD-10-CM

## 2017-03-23 DIAGNOSIS — J45991 Cough variant asthma: Secondary | ICD-10-CM

## 2017-03-23 MED ORDER — PROMETHAZINE-CODEINE 6.25-10 MG/5ML PO SYRP: 5.0000 mL | ORAL_SOLUTION | Freq: Four times a day (QID) | ORAL | 0 refills | Status: DC | PRN

## 2017-03-23 NOTE — Assessment & Plan Note (Signed)
Refills on Qvar and Protonix

## 2017-03-23 NOTE — Patient Instructions (Signed)
You seem to have a post bronchitic cough , which may be worsened by reflux  Refills on Qvar and Protonix Medrol Dosepak Promethazine codeine cough syrup-5 ML at bedtime for 3 weeks Take Delsym in the daytime  Call if no better

## 2017-03-23 NOTE — Assessment & Plan Note (Addendum)
Likely a post bronchitic cough , which may be worsened by reflux   Medrol Dosepak Promethazine codeine cough syrup-5 ML at bedtime for 3 weeks Take Delsym in the daytime  Call if no better

## 2017-03-23 NOTE — Progress Notes (Signed)
   Subjective:    Patient ID: Megan DadaStefanie Bechtold, female    DOB: 11/09/1990, 26 y.o.   MRN: [id number]  HPI  Chief Complaint  Patient presents with  . Acute Visit    Cough for the past 3 weeks. Started out as productive cough but is now a non-productive cough.    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 She remains on Pepcid and Protonix for GERD 24 weeks ago she developed a cold with settled into her chest, initially productive sputum and now mostly dry cough with minimal clear sputum in the mornings. Cough bothers her a lot during the day. She has tried over-the-counter Delsym. 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    Significant tests/ events reviewed  spirometry wnl  04/06/2013  - allergy profile 11/30/2013 >  IgE  24.2 with dust POS RAST/ neg eos  Review of Systems  neg for any significant sore throat, dysphagia, itching, sneezing, nasal congestion or excess/ purulent secretions, fever, chills, sweats, unintended wt loss, pleuritic or exertional cp, hempoptysis, orthopnea pnd or change in chronic leg swelling. Also denies presyncope, palpitations, heartburn, abdominal pain, nausea, vomiting, diarrhea or change in bowel or urinary habits, dysuria,hematuria, rash, arthralgias, visual complaints, headache, numbness weakness or ataxia.     Objective:   Physical Exam  Gen. Pleasant, well-nourished, in no distress ENT - no thrush, no post nasal drip Neck: No JVD, no thyromegaly, no carotid bruits Lungs: no use of accessory muscles, no dullness to percussion, clear without rales or rhonchi  Cardiovascular: Rhythm regular, heart sounds  normal, no murmurs or gallops, no peripheral  edema Musculoskeletal: No deformities, no cyanosis or clubbing        Assessment & Plan:

## 2017-03-23 NOTE — Addendum Note (Signed)
Addended by: Maurene CapesPOTTS, Tayshun Gappa M on: 03/23/2017 10:56 AM   Modules accepted: Orders

## 2017-04-18 ENCOUNTER — Encounter: Payer: Self-pay | Admitting: Pulmonary Disease

## 2017-04-18 ENCOUNTER — Other Ambulatory Visit: Payer: Self-pay

## 2017-04-18 MED ORDER — BECLOMETHASONE DIPROP HFA 40 MCG/ACT IN AERB: 2.0000 | INHALATION_SPRAY | Freq: Two times a day (BID) | RESPIRATORY_TRACT | 5 refills | Status: DC

## 2017-04-18 MED ORDER — PANTOPRAZOLE SODIUM 40 MG PO TBEC: 40.0000 mg | DELAYED_RELEASE_TABLET | Freq: Every day | ORAL | 5 refills | Status: DC

## 2017-04-20 IMAGING — CT CT ABD-PELV W/ CM
2 of 4 series · 16 of 46 positions shown, 18 images · IV contrast (OMNIPAQUE 300)
Comparison: None.

CLINICAL DATA: Left upper quadrant pain for 4 days

EXAM:
CT ABDOMEN AND PELVIS WITH CONTRAST
TECHNIQUE: Multidetector CT imaging of the abdomen and pelvis was performed
using the standard protocol following bolus administration of
intravenous contrast.
CONTRAST:  50mL OMNIPAQUE IOHEXOL 300 MG/ML SOLN, 80mL OMNIPAQUE
IOHEXOL 300 MG/ML SOLN

[Series 2: abd/pel with · axial · 0.64mm/px · z∈[+1157,+1527]mm · 13 of 82 slices shown, 15 images]
[im 4/82  soft-tissue]
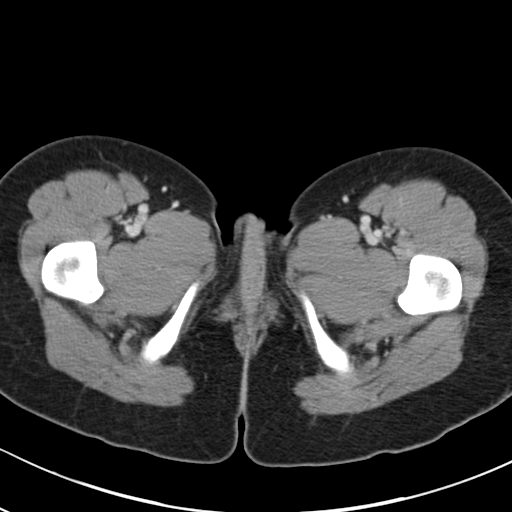
[im 4/82  bone]
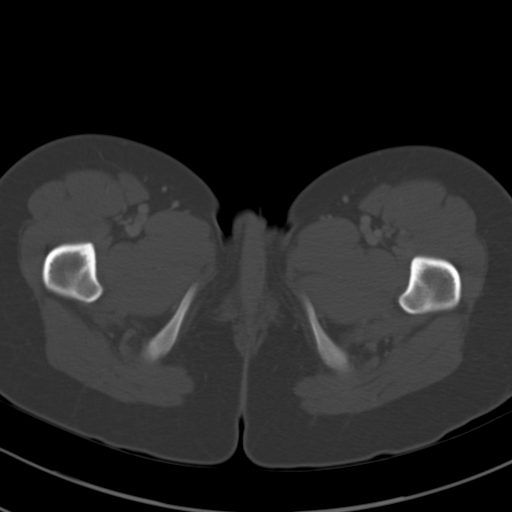
[im 10/82  soft-tissue]
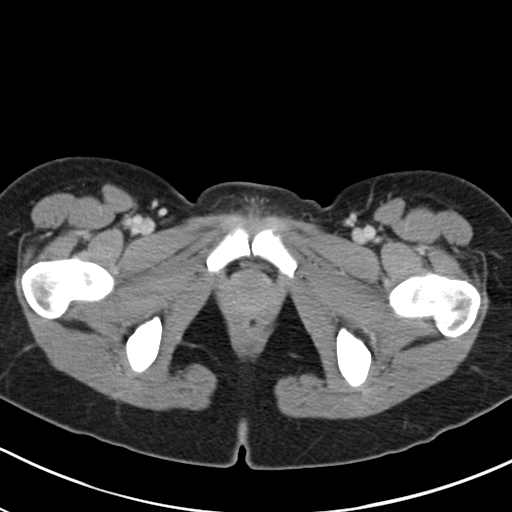
[im 17/82  soft-tissue]
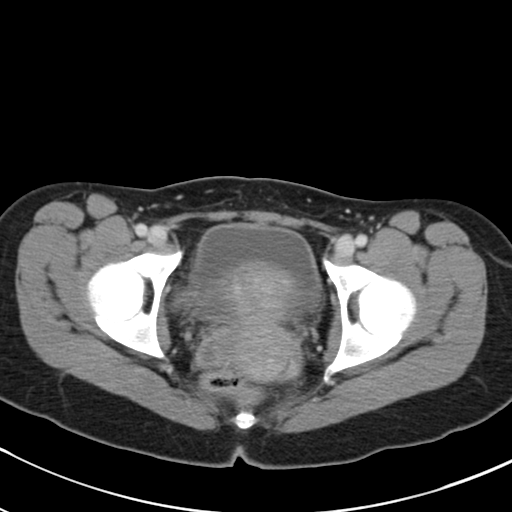
[im 23/82  soft-tissue]
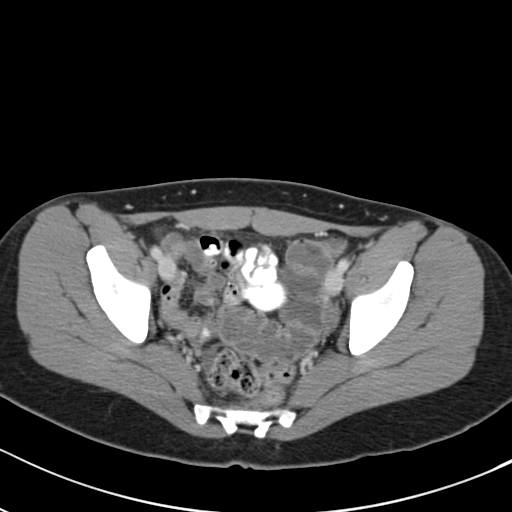
[im 30/82  soft-tissue]
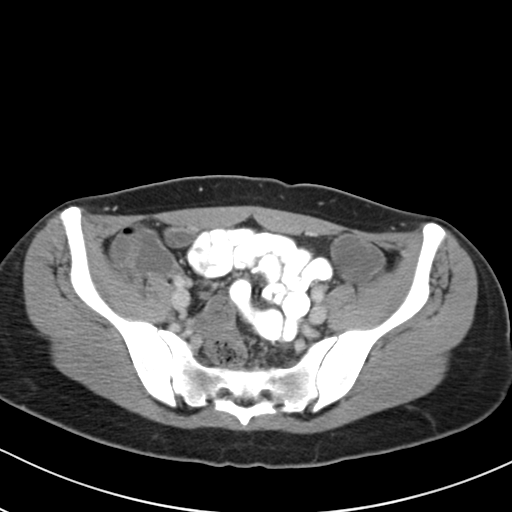
[im 36/82  soft-tissue]
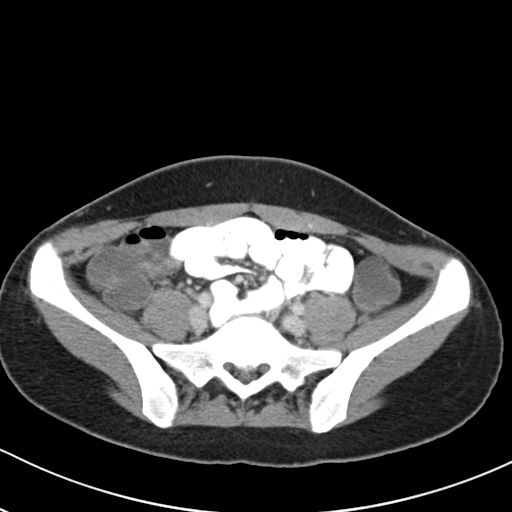
[im 43/82  soft-tissue]
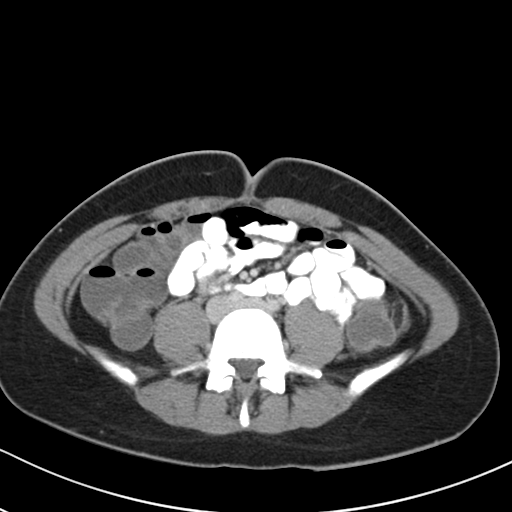
[im 46/82  soft-tissue]
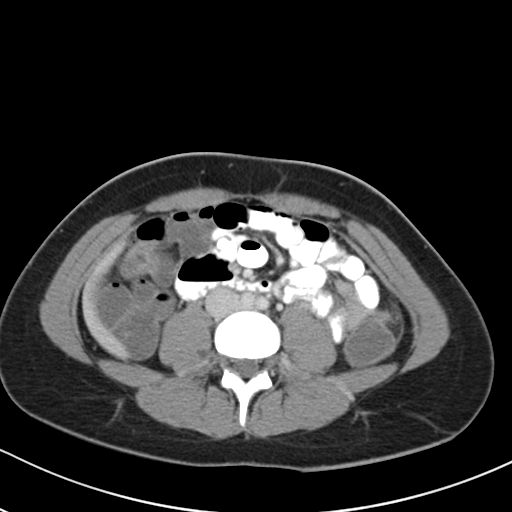
[im 52/82  soft-tissue]
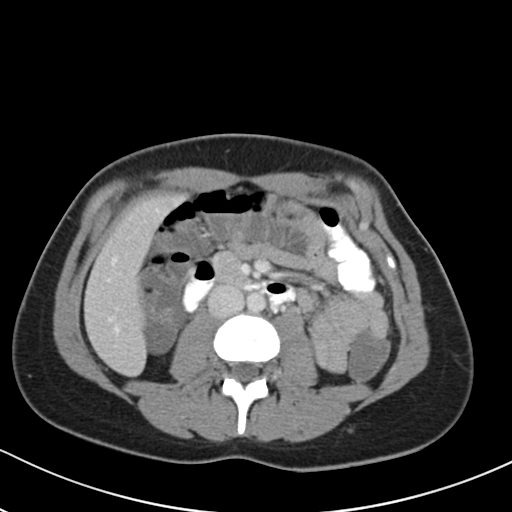
[im 52/82  bone]
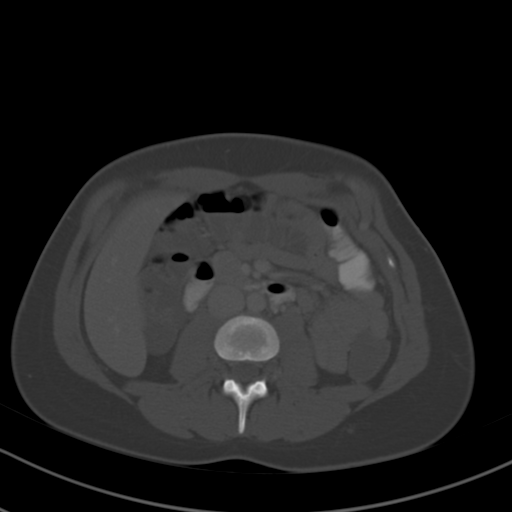
[im 59/82  soft-tissue]
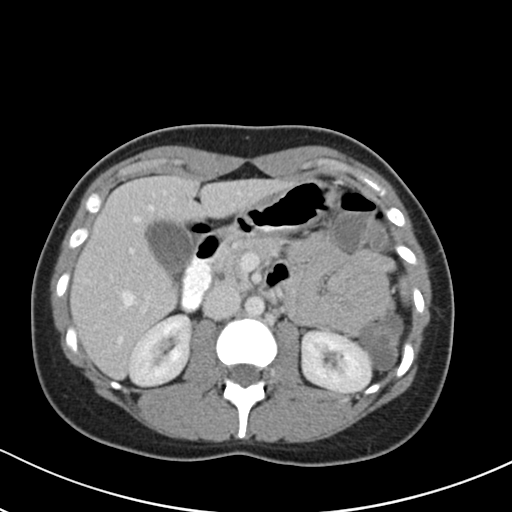
[im 65/82  soft-tissue]
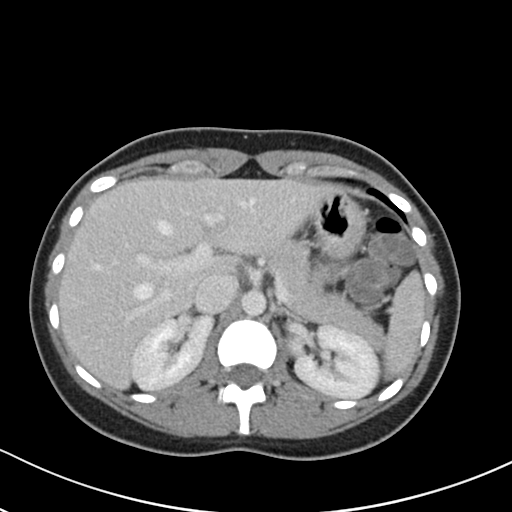
[im 72/82  soft-tissue]
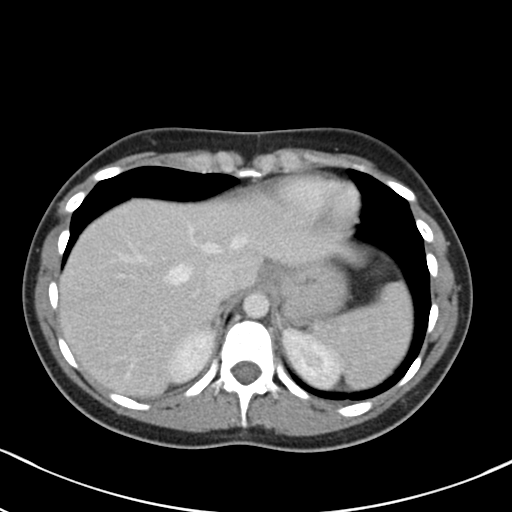
[im 78/82  soft-tissue]
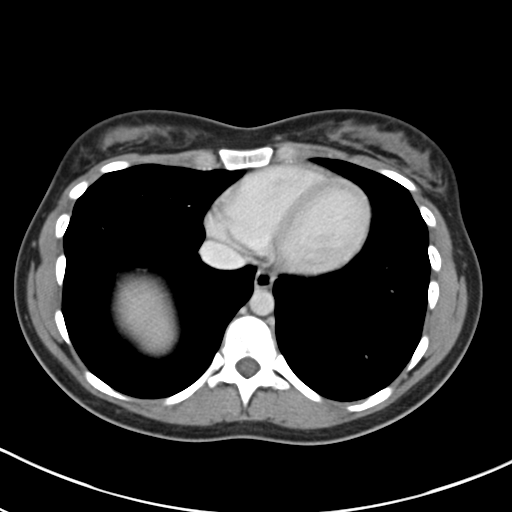

[Series 4: coronal a/|p · coronal · 0.59mm/px · 3 of 70 slices shown]
[im 24/70  soft-tissue]
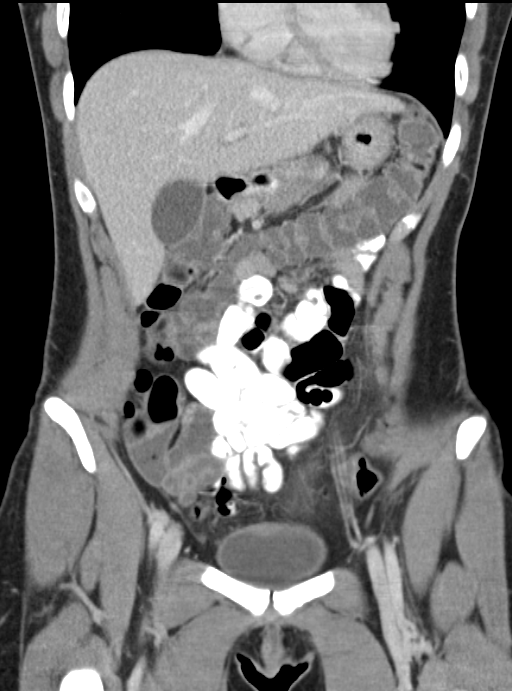
[im 31/70  soft-tissue]
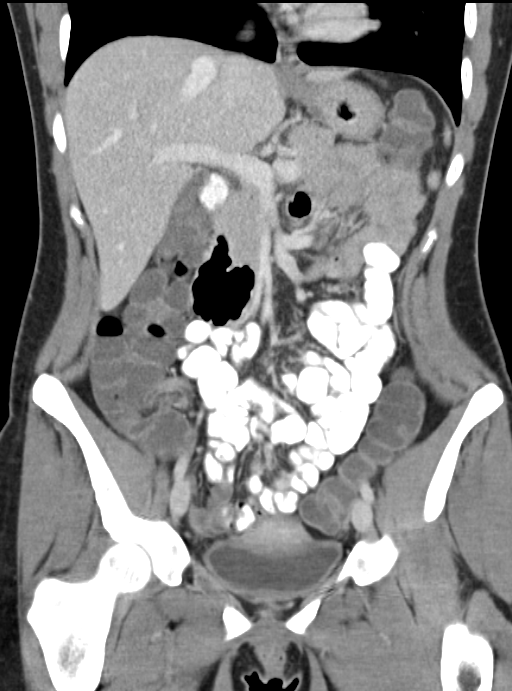
[im 39/70  soft-tissue]
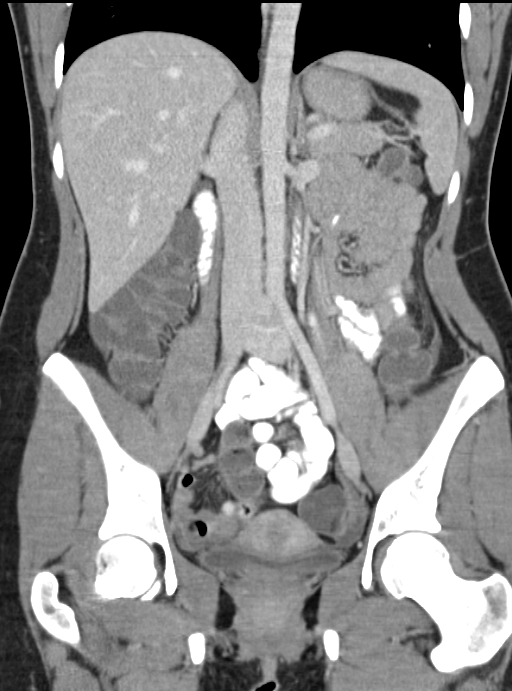

[16 of 46 positions shown; findings below may reference images not displayed]

FINDINGS: Lung bases are free of acute infiltrate or sizable effusion.

The liver, gallbladder, spleen, adrenal glands and pancreas are all
normal in their CT appearance. The kidneys are well visualized
bilaterally and reveal a normal enhancement pattern. No renal
calculi or obstructive changes are noted.

The appendix is well visualized. Fluid is noted throughout the small
bowel and colon without obstructive change. No changes to suggest
diverticulitis are seen. No vascular abnormality is noted. Pelvis
shows the bladder to be well distended. No pelvic mass lesion or
sidewall abnormality is noted. Bony structures are within normal
limits.
IMPRESSION: No acute abnormality noted.

## 2017-05-03 ENCOUNTER — Ambulatory Visit (INDEPENDENT_AMBULATORY_CARE_PROVIDER_SITE_OTHER): Payer: PRIVATE HEALTH INSURANCE | Admitting: Internal Medicine

## 2017-05-03 ENCOUNTER — Encounter: Payer: Self-pay | Admitting: Internal Medicine

## 2017-05-03 VITALS — BP 118/80 | HR 77 | Ht 64.0 in | Wt 123.8 lb

## 2017-05-03 DIAGNOSIS — R05 Cough: Secondary | ICD-10-CM | POA: Diagnosis not present

## 2017-05-03 DIAGNOSIS — J45991 Cough variant asthma: Secondary | ICD-10-CM

## 2017-05-03 DIAGNOSIS — R058 Other specified cough: Secondary | ICD-10-CM

## 2017-05-03 MED ORDER — TRAMADOL HCL 50 MG PO TABS: ORAL_TABLET | ORAL | 0 refills | Status: DC

## 2017-05-03 MED ORDER — BUDESONIDE-FORMOTEROL FUMARATE 80-4.5 MCG/ACT IN AERO: INHALATION_SPRAY | RESPIRATORY_TRACT | 11 refills | Status: DC

## 2017-05-03 MED ORDER — BUDESONIDE-FORMOTEROL FUMARATE 80-4.5 MCG/ACT IN AERO: 2.0000 | INHALATION_SPRAY | Freq: Two times a day (BID) | RESPIRATORY_TRACT | 0 refills | Status: AC

## 2017-05-03 MED ORDER — METHYLPREDNISOLONE 4 MG PO TABS: ORAL_TABLET | ORAL | 0 refills | Status: DC

## 2017-05-03 MED ORDER — ACETAMINOPHEN-CODEINE #3 300-30 MG PO TABS: ORAL_TABLET | ORAL | 0 refills | Status: AC

## 2017-05-03 NOTE — Patient Instructions (Addendum)
Augmentin 875 mg take one pill twice daily  X 10 days - take at breakfast and supper with large glass of water.  It would help reduce the usual side effects (diarrhea and yeast infections) if you ate cultured yogurt at lunch.   Medrol 4 mg 4 x 2days , 2 x 2days  and 1 x 2 days and stop   Change qvar to symbicort 80 Take 2 puffs first thing in am and then another 2 puffs about 12 hours later.      Take delsym two tsp every 12 hours and supplement if needed with tylenol #3  up to 2 every 4 hours to suppress the urge to cough. Swallowing water or using ice chips/non mint and menthol containing candies (such as lifesavers or sugarless jolly ranchers) are also effective.  You should rest your voice and avoid activities that you know make you cough.  Once you have eliminated the cough for 3 straight days try reducing the tylenol #3 first,  then the delsym as tolerated.    Always cough into the flutter valve to prevent airway trauma from coughing    Return in 2 weeks if not 100% better - if 100% better then stop the protonix and take the pepcid after bfast x 1 week then try off it also

## 2017-05-03 NOTE — Progress Notes (Signed)
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 Cough is mostly dry, worse when lies down unless takes codeine containing cough syrups.  rec Symbicort 80 Take 2 puffs first thing in am and then another 2 puffs about 12 hours later.  Stop advair and codeine cough syrup     01/02/2015 f/u ov/[name] re: cough variant asthma on qvar 40 2 bid/singulair/ppi and pepcid (started singulair back during her allergy season? Helped) Chief Complaint  Patient presents with  . Follow-up    Pt states following up early due to insurance change. She c/o PND but denies a cough. She has not had to use albuterol recently.   Still has daily sense of pnds controlled with chlortrimeton, no noct flares or resp problems sleeping  rec Ok to try off protonix and pepcid for now but restart right away for any flare  As needed For drainage take chlortrimeton (chlorpheniramine) 4 mg every 4 hours available over the counter (may cause drowsiness)    01/14/2016  f/u ov/[name] re:  Cough variant asthma on qvar 40 bid Off gerd rx/ takes singulair during the allergy seasons only No trouble with exercise/sleep and no "winter bronchitis" this year  rec For drainage / throat tickle try take CHLORPHENIRAMINE  4 mg - take one every 4 hours as needed - available over the counter- may cause drowsiness so start with just a bedtime dose or two and see how you tolerate it before trying in daytime   F/u prn     06/02/2016 acute extended ov/[name] re: cough variant asthma/ cough x one month  Chief Complaint  Patient presents with  . Acute Visit    Pt c/o cough for the past 3-4 wks. Cough is occ prod with clear sputum. Cough seems esp worse at night. She had some chest tightness last wk- used albuterol and this helped.    acute onset  with uri/ stuffy head same illness as boyfriend  X 4 weeks prior to OV  And all symptoms resolved x for the mostly dry cough with some throat irritation p coughing fit   rec Pantoprazole (protonix) 40 mg   Take  30-60 min before first meal of the day and Pepcid (famotidine)  20 mg one @  bedtime until no cough for a week or need for cough med Depomedrol 120 mg IM  For drainage / throat tickle try take CHLORPHENIRAMINE  4 mg - take one every 4 hours as needed - available over the counter- may cause drowsiness so start with just a bedtime dose or two and see how you tolerate it before trying in daytime   Delsym 2 tsp every 12 hours as needed and tylenol #3 if still coughing with goal of no cough x 3 days GERD diet  The progesterone component of your birth control may be contributing to reflux induced coughing and may need to be reduced if feasible  06/18/2016  f/u ov/[name] re: persistent cough x early Nov 2017  Chief Complaint  Patient presents with  . Follow-up    Cough worse, esp at night to the point of gasping for breath. Cough is occ prod with minimal green sputum.    only used 13 tyl #3 since last ov Traces of green mucus in am but mostly harsh/non-productive coughing fits day > noct  rec Augmentin 875 mg take one pill twice daily  X 10 days   Medrol 4 mg 4 x 2days , 2 x 2days  and 1 x 2 days and stop  Be sure you take Pepcid 20 mg at bedtime  Take delsym two tsp every 12 hours and supplement if needed with tylenol #3  up to 2 every 4 hours. Once you have eliminated the cough for 3 straight days try reducing the tylenol #3 first,  then the delsym as  tolerated.   Always cough into the flutter valve to prevent airway trauma from coughing   April 2018 changed to IUD with levonorgertel    Did fine on ppi/ h2 hs / qvar but no albuterol then early August 2018 recurrent cough 03/23/17 Refills on Qvar and Protonix Medrol Dosepak Promethazine codeine cough syrup-5 ML at bedtime for 3 weeks Take Delsym in the daytime   05/03/2017 acute extended ov/[name] re: cough falre  Chief Complaint  Patient presents with  . Acute Visit    Pt has productive cough, coughing up thick green mucus. Pt is having chest tightness and pain in upper back.    transiently better but not gone p last ov and never able to shut the cough down completely assoc with minimal nasal congestion / some better p saba despite qvar 40 2bid  / cough now 24/7 severe hacking assoc with chest tightness and gen post cp with coughing fits  No obvious day to day or daytime variability or assoc   mucus plugs or hemoptysis or  subjective wheeze or overt   hb symptoms. No unusual exp hx or h/o childhood pna/ asthma or knowledge of premature birth.  Sleeping ok flat without nocturnal  or early am exacerbation  of respiratory  c/o's or need for noct saba. Also denies any obvious fluctuation of symptoms with weather or environmental changes or other aggravating or alleviating factors except as outlined above   Current Allergies, Complete Past Medical History, Past Surgical History, Family History, and Social History were reviewed in [employer] record.  ROS  The following are not active complaints unless bolded Hoarseness, sore throat, dysphagia, dental problems, itching, sneezing,  nasal congestion or discharge of excess mucus or purulent secretions, ear ache,   fever, chills, sweats, unintended wt loss or wt gain, classically pleuritic or exertional cp,  orthopnea pnd or leg swelling, presyncope, palpitations, abdominal pain, anorexia, nausea, vomiting, diarrhea  or change  in bowel habits or change in bladder habits, change in stools or change in urine, dysuria, hematuria,  rash, arthralgias, visual complaints, headache, numbness, weakness or ataxia or problems with walking or coordination,  change in mood/affect or memory.        Current Meds  Medication Sig  . albuterol (PROAIR HFA) 108 (90 Base) MCG/ACT inhaler Inhale 2 puffs into the lungs every 6 (six) hours as needed for wheezing or shortness of breath.  . chlorpheniramine (CHLOR-TRIMETON) 4 MG tablet Take 4 mg by mouth every 4 (four) hours as needed for allergies.  Marland Kitchen dextromethorphan (DELSYM) 30 MG/5ML liquid Take by  mouth as needed for cough.  . famotidine (PEPCID) 20 MG tablet One at bedtime  . Levonorgestrel (SKYLA) 13.5 MG IUD by Intrauterine route.  . pantoprazole (PROTONIX) 40 MG tablet Take 1 tablet (40 mg total) by mouth daily. Take 30-60 min before first meal of the day  . Respiratory Therapy Supplies (FLUTTER) DEVI Use as directed  . [  beclomethasone (QVAR REDIHALER) 40 MCG/ACT inhaler Inhale 2 puffs into the lungs 2 (two) times daily.  .   beclomethasone (QVAR) 40 MCG/ACT inhaler Take 2 puffs first thing in am and then another 2 puffs about 12 hours later.  .   promethazine-codeine (PHENERGAN WITH CODEINE) 6.25-10 MG/5ML syrup Take 5 mLs by mouth every 6 (six) hours as needed for cough.                        Objective:   Physical Exam  11/30/13  130 > 05/29/2014  135 > 07/26/2014    131 >128 08/27/2014 > 11/11/2014   127 >126 11/29/2014> 01/02/2015   134 > 06/02/2016  132 > 06/18/2016  132 >  05/03/2017  123   amb wf severe  honking dry upper airway pattern coughing    Vital signs reviewed - Note on arrival 02 sats  100% on RA    HEENT: nl dentition,   oropharynx  . Nl external ear canals without cough reflex - mild  bilateral non-specific turbinate edema     NECK :  without JVD/Nodes/TM/ nl carotid upstrokes bilaterally,    LUNGS: no acc muscle use,  Cough on mid expiration assoc  with mild wheeze better with plm    CV:  RRR  no s3 or murmur or increase in P2, no edema   ABD:  soft and nontender with nl excursion in the supine position. No bruits or organomegaly, bowel sounds nl  MS:  warm without deformities, calf tenderness, cyanosis or clubbing  SKIN: warm and dry without lesions  , no rashes            Assessment & Plan:

## 2017-05-04 ENCOUNTER — Encounter: Payer: Self-pay | Admitting: Internal Medicine

## 2017-05-04 NOTE — Assessment & Plan Note (Signed)
-   hfa 90% 06/29/12> rx symbicort 80 > resolved  - spirometry wnl  04/06/2013  - allergy profile 11/30/2013 >  IgE  24.2 with dust POS RAST/ neg eos -  05/29/2014 flared on symbicort 80 and singulair > stop both and start qvar 80 one bid and gerd rx  - 07/26/14 better on just qvar 80 one bid  -  Cough exac on qvar 80 2bid so rec qvar 40 2bid > improved at ov 01/02/2015  - FENO 06/02/2016  =  12 on qvar 40 2bid during flare of cough off gerd rx/ on bcps / p uri   - 05/03/2017  After extensive coaching HFA effectiveness =    90% > try change qvar to symb 80 2bid  She is now having to use saba to supplement qvar so need to try step back up to laba/ics but in low doses so as not to trigger/aggravate the uacs worse than it already is (see separate a/p) and add another short course of prednisone   I had an extended discussion with the patient reviewing all relevant studies completed to date and  lasting 15 to 20 minutes of a 25 minute acute  visit    Each maintenance medication was reviewed in detail including most importantly the difference between maintenance and prns and under what circumstances the prns are to be triggered using an action plan format that is not reflected in the computer generated alphabetically organized AVS.    Please see AVS for specific instructions unique to this visit that I personally wrote and verbalized to the the pt in detail and then reviewed with pt  by my nurse highlighting any  changes in therapy recommended at today's visit to their plan of care.

## 2017-05-04 NOTE — Assessment & Plan Note (Addendum)
Having another acute flare in setting of ? [place]  ?  sinusitis > rx augmentin x 10 days   Of the three most common causes of  Sub-acute or recurrent or chronic cough, only one (GERD)  can actually contribute to/ trigger  the other two (asthma and post nasal drip syndrome)  and perpetuate the cylce of cough.  While not intuitively obvious, many patients with chronic low grade reflux do not cough until there is a primary insult that disturbs the protective epithelial barrier and exposes sensitive nerve endings.   This is typically viral as was probably the intial case here but can be direct physical injury such as with an endotracheal tube.   The point is that once this occurs, it is difficult to eliminate the cycle  using anything but a maximally effective acid suppression regimen at least in the short run, accompanied by an appropriate diet to address non acid GERD and control / eliminate the cough itself for at least 3 days.   Will use flutter valve and tyl 3# to eliminate cyclical cough then consider taper ppi off and just rx with pepcid when all better   see avs for instructions unique to this ov

## 2017-06-23 ENCOUNTER — Encounter: Payer: Self-pay | Admitting: Internal Medicine

## 2017-06-23 ENCOUNTER — Ambulatory Visit (INDEPENDENT_AMBULATORY_CARE_PROVIDER_SITE_OTHER): Payer: BLUE CROSS/BLUE SHIELD | Admitting: Internal Medicine

## 2017-06-23 VITALS — BP 104/76 | HR 74 | Ht 64.0 in | Wt 123.0 lb

## 2017-06-23 DIAGNOSIS — R05 Cough: Secondary | ICD-10-CM

## 2017-06-23 DIAGNOSIS — J45991 Cough variant asthma: Secondary | ICD-10-CM

## 2017-06-23 DIAGNOSIS — R058 Other specified cough: Secondary | ICD-10-CM

## 2017-06-23 MED ORDER — ALBUTEROL SULFATE HFA 108 (90 BASE) MCG/ACT IN AERS: 2.0000 | INHALATION_SPRAY | Freq: Four times a day (QID) | RESPIRATORY_TRACT | 2 refills | Status: AC | PRN

## 2017-06-23 MED ORDER — ALBUTEROL SULFATE HFA 108 (90 BASE) MCG/ACT IN AERS: 2.0000 | INHALATION_SPRAY | Freq: Four times a day (QID) | RESPIRATORY_TRACT | 2 refills | Status: DC | PRN

## 2017-06-23 NOTE — Progress Notes (Signed)
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 Cough is mostly dry, worse when lies down unless takes codeine containing cough syrups.  rec Symbicort 80 Take 2 puffs first thing in am and then another 2 puffs about 12 hours later > Improved  Stop advair and codeine cough syrup     06/02/2016 acute extended ov/Megan Holland re: cough variant asthma/ cough x one month  Chief Complaint  Patient presents with  . Acute Visit    Pt c/o cough for the past 3-4 wks. Cough is occ prod with clear sputum. Cough seems esp worse at night. She had some chest tightness last wk- used albuterol and this helped.    acute onset  with uri/ stuffy head same illness as boyfriend  X 4 weeks prior to OV  And all symptoms resolved x for the mostly dry cough with some throat irritation p coughing fit   rec Pantoprazole (protonix) 40 mg   Take  30-60 min before first meal of the day and Pepcid (famotidine)  20 mg one @  bedtime until no cough for a week or need for cough med Depomedrol 120 mg IM  For drainage / throat tickle try take CHLORPHENIRAMINE  4 mg - take one every 4 hours as needed - available over the counter- may cause drowsiness so start with just a bedtime dose or two and see how you tolerate it before trying in daytime   Delsym 2 tsp every 12 hours as needed and tylenol #3 if still coughing with goal of no cough x 3 days GERD diet  The progesterone component of your birth control may  be contributing to reflux induced coughing and may need to be reduced if feasible     06/18/2016  f/u ov/Megan Holland re: persistent cough x early Nov 2017  Chief Complaint  Patient presents with  . Follow-up    Cough worse, esp at night to the point of gasping for breath. Cough is occ prod with minimal green sputum.    only used 13 tyl #3 since last ov Traces of green mucus in am but mostly harsh/non-productive coughing fits day > noct  rec Augmentin 875 mg take one pill twice daily  X 10 days   Medrol 4 mg 4 x 2days , 2 x 2days  and 1 x 2 days and stop  Be sure you take Pepcid 20 mg at bedtime  Take delsym two tsp every 12 hours and supplement if needed with tylenol #3  up to 2 every 4 hours. Once you have eliminated the cough for 3 straight days try reducing the tylenol #3 first,  then the delsym as tolerated.   Always cough into the flutter valve to prevent airway trauma from coughing   April 2018 changed to IUD with levonorgertel    Did fine on ppi/ h2 hs / qvar but no albuterol then early August 2018 recurrent cough 03/23/17  Refills on Qvar and Protonix Medrol Dosepak Promethazine codeine cough syrup-5 ML at bedtime for 3 weeks Take Delsym in the daytime   05/03/2017 acute extended ov/Megan Holland re: cough flare   Chief Complaint  Patient presents with  . Acute Visit    Pt has productive cough, coughing up thick green mucus. Pt is having chest tightness and pain in upper back.    transiently better but not gone p last ov and never able to shut the cough down completely assoc with minimal nasal congestion / some better p saba despite qvar 40 2bid  / cough now 24/7 severe hacking assoc with chest tightness and gen post cp with coughing fits rec Augmentin 875 mg take one pill twice daily  X 10 days - take at breakfast and supper with large glass of water.  It would help reduce the usual side effects (diarrhea and yeast infections) if you ate cultured yogurt at lunch.  Medrol 4 mg 4 x 2days ,  2 x 2days  and 1 x 2 days and stop  Change qvar to symbicort 80 Take 2 puffs first thing in am and then another 2 puffs about 12 hours later.  Take delsym two tsp every 12 hours and supplement if needed with tylenol #3  up to 2 every 4 hours to suppress the urge to cough. Swallowing water or using ice chips/non mint and menthol containing candies (such as lifesavers or sugarless jolly ranchers) are also effective.  You should rest your voice and avoid activities that you know make you cough. Once you have eliminated the cough for 3 straight days try reducing the tylenol #3 first,  then the delsym as tolerated.   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 Follow-up    Cough has improved. She has occ throat clearing. She has not had to use her albuterol inhaler recently.   just taking pepcid bedtime / no longer on ppi  Ex ok including running inside  Sleeping ok   No obvious day to day or daytime variability or assoc excess/ purulent sputum or mucus plugs or hemoptysis or cp or chest tightness, subjective wheeze or overt sinus or hb symptoms. No unusual exposure hx or h/o childhood pna/ asthma or knowledge of premature birth.  Sleeping ok flat without nocturnal  or early am exacerbation  of respiratory  c/o's or need for noct saba. Also denies any obvious fluctuation of symptoms with weather or environmental changes or other aggravating or alleviating factors except as outlined above   Current Allergies, Complete Past Medical History, Past Surgical History, Family History, and Social History were reviewed in [facility] electronic medical record.  ROS  The following are not  active complaints unless bolded Hoarseness, sore throat, dysphagia, dental problems, itching, sneezing,  nasal congestion or discharge of excess mucus or purulent secretions, ear ache,   fever, chills, sweats, unintended wt loss or wt gain, classically pleuritic or exertional cp,  orthopnea pnd or leg swelling, presyncope, palpitations, abdominal pain, anorexia, nausea, vomiting, diarrhea  or change in bowel habits or change in bladder habits, change in  stools or change in urine, dysuria, hematuria,  rash, arthralgias, visual complaints, headache, numbness, weakness or ataxia or problems with walking or coordination,  change in mood/affect or memory.        Current Meds  Medication Sig  . acetaminophen-codeine (TYLENOL #3) 300-30 MG tablet One every 4 hours as needed for cough  . albuterol (PROAIR HFA) 108 (90 Base) MCG/ACT inhaler Inhale 2 puffs into the lungs every 6 (six) hours as needed for wheezing or shortness of breath.  . budesonide-formoterol (SYMBICORT) 80-4.5 MCG/ACT inhaler Inhale 2 puffs into the lungs 2 (two) times daily.  . chlorpheniramine (CHLOR-TRIMETON) 4 MG tablet Take 4 mg by mouth every 4 (four) hours as needed for allergies.  Marland Kitchen. dextromethorphan (DELSYM) 30 MG/5ML liquid Take by mouth as needed for cough.  . Levonorgestrel (SKYLA) 13.5 MG IUD by Intrauterine route.  . pantoprazole (PROTONIX) 40 MG tablet Take 1 tablet (40 mg total) by mouth daily. Take 30-60 min before first meal of the day (Patient taking differently: Take 40 mg by mouth daily. At bedtime)  . Respiratory Therapy Supplies (FLUTTER) DEVI Use as directed  .     Marland Kitchen.               Objective:   Physical Exam  amb wf nad   11/30/13  130 > 05/29/2014  135 > 07/26/2014    131 >128 08/27/2014 > 11/11/2014   127 >126 11/29/2014> 01/02/2015   134 > 06/02/2016  132 > 06/18/2016  132 >  05/03/2017  123 > 06/23/2017  123    Vital signs reviewed - Note on arrival 02 sats  99% on RA        HEENT: nl dentition, turbinates  bilaterally, and oropharynx. Nl external ear canals without cough reflex   NECK :  without JVD/Nodes/TM/ nl carotid upstrokes bilaterally   LUNGS: no acc muscle use,  Nl contour chest which is clear to A and P bilaterally without cough on insp or exp maneuvers   CV:  RRR  no s3 or murmur or increase in P2, and no edema   ABD:  soft and nontender with nl inspiratory excursion in the supine position. No bruits or organomegaly appreciated, bowel sounds nl  MS:  Nl gait/ ext warm without deformities, calf tenderness, cyanosis or clubbing No obvious joint restrictions   SKIN: warm and dry without lesions    NEURO:  alert, approp, nl sensorium with  no motor or cerebellar deficits apparent.                 Assessment & Plan:

## 2017-06-23 NOTE — Patient Instructions (Addendum)
In the event that the cough or throat clearing  flares:  Try prilosec otc 20mg   (or protonix 40 mg) Take 30-60 min before first meal of the day and Pepcid ac (famotidine) 20 mg one @  bedtime until cough is completely gone for at least a week without the need for cough suppression   GERD (REFLUX)  is an extremely common cause of respiratory symptoms just like yours , many times with no obvious heartburn at all.    It can be treated with medication, but also with lifestyle changes including elevation of the head of your bed (ideally with 6 inch  bed blocks),  Smoking cessation, avoidance of late meals, excessive alcohol, and avoid fatty foods, chocolate, peppermint, colas, red wine, and acidic juices such as orange juice.  NO MINT OR MENTHOL PRODUCTS SO NO COUGH DROPS  USE SUGARLESS CANDY INSTEAD (Jolley ranchers or Stover's or Life Savers) or even ice chips will also do - the key is to swallow to prevent all throat clearing. NO OIL BASED VITAMINS - use powdered substitutes.  Follow up is as needed when you return to this area or you can let a primary care provider refill you meds when they expire in a year

## 2017-06-25 ENCOUNTER — Encounter: Payer: Self-pay | Admitting: Internal Medicine

## 2017-06-25 NOTE — Assessment & Plan Note (Signed)
-  hfa 90% 06/29/12> rx symbicort 80 > resolved  - spirometry wnl  04/06/2013  - allergy profile 11/30/2013 >  IgE  24.2 with dust POS RAST/ neg eos -  05/29/2014 flared on symbicort 80 and singulair > stop both and start qvar 80 one bid and gerd rx  - 07/26/14 better on just qvar 80 one bid  -  Cough exac on qvar 80 2bid so rec qvar 40 2bid > improved at ov 01/02/2015  - FENO 06/02/2016  =  12 on qvar 40 2bid during flare of cough off gerd rx/ on bcps / p uri  - 05/03/2017  After extensive coaching HFA effectiveness =    90% > try change qvar to symb 80 2bid > improved 06/23/2017      All goals of chronic asthma control met including optimal function and elimination of symptoms with minimal need for rescue therapy.  Contingencies discussed in full including contacting this office immediately if not controlling the symptoms using the rule of two's.

## 2017-06-25 NOTE — Assessment & Plan Note (Addendum)
-   flutter added 06/18/2016 > improved   Resolved for now but reviewed:  Of the three most common causes of  Sub-acute or recurrent or chronic cough, only one (GERD)  can actually contribute to/ trigger  the other two (asthma and post nasal drip syndrome)  and perpetuate the cylce of cough.  While not intuitively obvious, many patients with chronic low grade reflux do not cough until there is a primary insult that disturbs the protective epithelial barrier and exposes sensitive nerve endings.   This is typically viral but can be direct physical injury such as with an endotracheal tube.   The point is that once this occurs, it is difficult to eliminate the cycle  using anything but a maximally effective acid suppression regimen at least in the short run, accompanied by an appropriate diet to address non acid GERD and control / eliminate the cough itself for at least 3 days with use of flutter valve

## 2018-05-01 ENCOUNTER — Other Ambulatory Visit: Payer: Self-pay | Admitting: Pulmonary Disease
